# Patient Record
Sex: Male | Born: 1970
Health system: Southern US, Community
[De-identification: ages and names within clinical notes are randomized; demographics above are authoritative.]

## PROBLEM LIST (undated history)

## (undated) DIAGNOSIS — M1990 Unspecified osteoarthritis, unspecified site: Secondary | ICD-10-CM

## (undated) HISTORY — PX: OTHER SURGICAL HISTORY: SHX169

---

## 2004-11-06 ENCOUNTER — Encounter: Admission: RE | Admit: 2004-11-06 | Discharge: 2004-11-06 | Payer: Self-pay | Admitting: Gastroenterology

## 2004-11-13 ENCOUNTER — Encounter (INDEPENDENT_AMBULATORY_CARE_PROVIDER_SITE_OTHER): Payer: Self-pay | Admitting: *Deleted

## 2004-11-13 ENCOUNTER — Ambulatory Visit (HOSPITAL_COMMUNITY): Admission: RE | Admit: 2004-11-13 | Discharge: 2004-11-13 | Payer: Self-pay | Admitting: Gastroenterology

## 2005-06-21 ENCOUNTER — Ambulatory Visit (HOSPITAL_BASED_OUTPATIENT_CLINIC_OR_DEPARTMENT_OTHER): Admission: RE | Admit: 2005-06-21 | Discharge: 2005-06-21 | Payer: Self-pay | Admitting: Orthopedic Surgery

## 2006-05-22 ENCOUNTER — Ambulatory Visit (HOSPITAL_COMMUNITY): Admission: RE | Admit: 2006-05-22 | Discharge: 2006-05-22 | Payer: Self-pay | Admitting: Orthopaedic Surgery

## 2006-10-24 ENCOUNTER — Ambulatory Visit (HOSPITAL_BASED_OUTPATIENT_CLINIC_OR_DEPARTMENT_OTHER): Admission: RE | Admit: 2006-10-24 | Discharge: 2006-10-24 | Payer: Self-pay | Admitting: Orthopaedic Surgery

## 2007-07-12 ENCOUNTER — Emergency Department (HOSPITAL_COMMUNITY): Admission: EM | Admit: 2007-07-12 | Discharge: 2007-07-12 | Payer: Self-pay | Admitting: Emergency Medicine

## 2007-09-09 ENCOUNTER — Ambulatory Visit (HOSPITAL_COMMUNITY): Admission: RE | Admit: 2007-09-09 | Discharge: 2007-09-09 | Payer: Self-pay | Admitting: Orthopedic Surgery

## 2008-06-30 ENCOUNTER — Encounter: Admission: RE | Admit: 2008-06-30 | Discharge: 2008-06-30 | Payer: Self-pay | Admitting: Family Medicine

## 2009-01-27 ENCOUNTER — Ambulatory Visit (HOSPITAL_COMMUNITY): Admission: RE | Admit: 2009-01-27 | Discharge: 2009-01-27 | Payer: Self-pay | Admitting: Orthopedic Surgery

## 2010-03-27 ENCOUNTER — Emergency Department (HOSPITAL_COMMUNITY): Admission: EM | Admit: 2010-03-27 | Discharge: 2010-03-27 | Payer: Self-pay | Admitting: Family Medicine

## 2010-04-07 ENCOUNTER — Ambulatory Visit
Admission: RE | Admit: 2010-04-07 | Discharge: 2010-04-07 | Payer: Self-pay | Source: Home / Self Care | Attending: Orthopaedic Surgery | Admitting: Orthopaedic Surgery

## 2010-05-26 NOTE — Op Note (Signed)
  NAME:  [name] [name], [name] [name]                  ACCOUNT NO.:  [id number]  MEDICAL RECORD NO.:  [id number]          PATIENT TYPE:  AMB  LOCATION:  DSC                          FACILITY:  [facility]  PHYSICIAN:  Claude Manges. Whitfield, M.D.DATE OF BIRTH:  11-02-1970  DATE OF PROCEDURE:  04/07/2010 DATE OF DISCHARGE:                              OPERATIVE REPORT   PREOPERATIVE DIAGNOSIS:  Left carpal tunnel syndrome.  POSTOPERATIVE DIAGNOSIS:  Left carpal tunnel syndrome.  PROCEDURE:  Release of volar carpal ligament, left wrist with decompression of median nerve.  SURGEON:  Claude Manges. Cleophas Dunker, MD  ASSISTANT:  Oris Drone. Petrarca, PAC  ANESTHESIA:  Bier block with IV Xylocaine.  COMPLICATIONS:  None.  HISTORY:  A 40 year old gentleman has EMG diagnosed left carpal tunnel syndrome.  He has reached a point where it has become a compromise of his activity.  He is having numbness when he drives his car at night and even with his vocational activities.  He has had a prior right carpal tunnel release approximately 5 years ago with excellent result, and now wishes to proceed with release of the left carpal tunnel.  PROCEDURE IN DETAIL:  [name] [name] was met in the holding area.  I marked his left hand as the appropriate operative site.  The patient was then transported to room #4 and IV Xylocaine was administered by Anesthesia. Tourniquet was applied to the left arm.  The arm was prepped with DuraPrep in tips of the fingers.  The tourniquet sterile draping was performed.  A longitudinal incision just over an inch in length was outlined along the longitudinal palmar crease.  Via sharp dissection, incision was carried down to the subcutaneous tissue.  Skin was thick.  There was considerable fascial tissue that was released bluntly to reveal fibers of the palmaris longus.  These were then carefully separated all the way and radially revealing the volar carpal ligament beneath.  A Glorious Peach was placed along its  distal extent, and I carefully incised its ulnar border.  The median nerve was identified beneath.  The retractor was then placed underneath the wrist skin and the volar carpal ligament was completely released under direct visualization along its entire length, thus completely decompressing the nerve.  The nerve was quite pale along an area of about an inch without a median vein and reconstituted at the end of the procedure.  The motor recurrent branch was intact.  I did not see evidence of tenosynovitis.  Wound was then irrigated with saline solution.  The skin was closed with interrupted 4-0 Ethilon.  A sterile bulky dressing was applied with posterior splints and Ace bandage.  Tourniquet was deflated with immediate capillary refill to the fingers.  The patient tolerated the procedure without complications.  PLAN:  Oxycodone for pain.  Office in 1 week.     Claude Manges. Cleophas Dunker, M.D.     PWW/MEDQ  D:  04/07/2010  T:  04/07/2010  Job:  [id number]  Electronically Signed by Norlene Campbell M.D. on 05/24/2010 09:02:15 AM

## 2010-07-10 LAB — POCT HEMOGLOBIN-HEMACUE: Hemoglobin: 14.4 g/dL (ref 13.0–17.0)

## 2010-09-12 NOTE — Op Note (Signed)
NAME:  [name] [name], [name] [name]                  ACCOUNT NO.:  [id number]   MEDICAL RECORD NO.:  [id number]           PATIENT TYPE:   LOCATION:                                 FACILITY:   PHYSICIAN:  Claude Manges. Whitfield, M.D.DATE OF BIRTH:  1970-05-23   DATE OF PROCEDURE:  10/24/2006  DATE OF DISCHARGE:                               OPERATIVE REPORT   PREOPERATIVE DIAGNOSIS:  Impingement left shoulder.   POSTOPERATIVE DIAGNOSIS:  Impingement left shoulder.   PROCEDURE:  Diagnostic arthroscopy left shoulder with arthroscopic  subacromial decompression.   SURGEON:  Claude Manges. Cleophas Dunker, M.D.   ASSISTANT:  Legrand Pitts. Duffy, P.A.-C   ANESTHESIA:  General with interscalene nerve block.   COMPLICATIONS:  None.   HISTORY:  A 40 year old gentleman has been followed for months in  reference to his left shoulder.  He has had injection in November in the  subacromial region that made a huge difference; and has had recurrence  to the point of compromise.  Clinically he has impingement.  He has had  an MRI scan that reveals mild-appearing rotator cuff tendinopathy  without a tear.  The [facility] joint was unremarkable.  The biceps tendon was  in the groove; and there was no evidence of a labral tear.  He is now to  have an arthroscopic evaluation.   DESCRIPTION OF PROCEDURE:  The patient was comfortable on the operating  room table; and under general orotracheal anesthesia the patient was  placed in a semi-sitting position with the shoulder frame.  He did have  a preoperative interscalene nerve block.  The left shoulder was then  prepped with DuraPrep in the base of neck circumferentially below the  elbow.  Sterile draping was performed.   A marking pen was used to outline the coracoid, the acromion and the [facility]  joint.  At a point of fingerbreadth posterior medial to the angle of the  acromion a small stab wound was made; and the arthroscope easily placed  into the shoulder joint.  Diagnostic arthroscopy  revealed no evidence of  chondromalacia of the humeral head or the glenoid.  The anterior glenoid  labrum appeared to be intact.  The subscapularis was intact.  There were  no loose bodies.  Biceps tendon was intact.  There was no evidence of a  rotator cuff tear.   The arthroscope was then placed in the subacromial space posteriorly;  the cannula and subacromial space anteriorly, and a third portal  established in the lateral subacromial space.  Arthroscopy revealed  moderate amount of bursal tissue.   Accordingly, ArthroCare wand was introduced and this was debrided so  that I could better visualize the cuff.  I did not see any evidence of  bursal cuff tear irritation.  The [facility] joint appeared to be intact.  There  was considerable overhang of the acromion; and a very thickened CA  ligament.  The ArthroCare wand was used to release the CA ligament and a  6-mm bur was used to perform the subacromial decompression.  He had  very nice decompression, and a nice dry  field.  At that point the three  wounds were irrigated.  The anterior wound was closed with interrupted 4-  0 Ethilon.  They were infiltrated with 0.25% Marcaine with epinephrine.  A sterile bulky dressing was applied followed by a sling.  The patient  tolerated the procedure without complications.   PLAN:  Percocet for pain.  Office in 1 week.      Claude Manges. Cleophas Dunker, M.D.  Electronically Signed     PWW/MEDQ  D:  10/24/2006  T:  10/24/2006  Job:  [id number]

## 2010-09-15 NOTE — Op Note (Signed)
NAME:  [name] [name], [name] [name]                  ACCOUNT NO.:  [id number]   MEDICAL RECORD NO.:  [id number]          PATIENT TYPE:  AMB   LOCATION:  ENDO                         FACILITY:  [facility]   PHYSICIAN:  Petra Kuba, M.D.    DATE OF BIRTH:  1971-04-01   DATE OF PROCEDURE:  11/13/2004  DATE OF DISCHARGE:                                 OPERATIVE REPORT   PROCEDURE:  Esophagogastroduodenoscopy with biopsy.   ENDOSCOPIST:  Petra Kuba, M.D.   INDICATIONS:  Abdominal pain, mild anemia, nondiagnostic ultrasound. Consent  was signed after risks, benefits, methods, options thoroughly discussed in  the office.   MEDICINES USED:  Demerol 60, Versed 6 mg.   DESCRIPTION OF PROCEDURE:  The v ideo endoscope was inserted by direct  vision. The esophagus was normal.  The scope passed in the stomach and  advanced to the antrum where some very minimal amount of antritis was seen  and advanced into a normal duodenal bulb around the C-loop to a normal  second portion of the duodenum.  Normal-appearing ampulla was quickly seen.  The scope was slowly withdrawn back to the bulb and a good look there ruled  out ulcers in that location.  The scope was withdrawn back the stomach and  retroflexed. Angularis, cardia, fundus, lesser and greater curve were normal  on retroflexed visualization.  Straight visualization of the stomach  confirmed some very minimal gastritis. No other abnormalities.  We went  ahead and took two biopsies of the antrum, two of the proximal stomach to  rule out Helicobacter.  The scope was reinserted into the duodenum one more  time which confirmed normal appearance.  The scope was withdrawn.  The air  was suctioned and the scope removed.  The esophagus was normal. Scope was  removed. The patient tolerated the procedure well. There was no obvious  immediate complication.   ENDOSCOPIC DIAGNOSIS:  1.  Minimal gastritis status post biopsy.  2.  Otherwise within normal limits  esophagogastroduodenoscopy.   PLAN:  1.  Continue Nexium.  2.  Get Dr. Michaelle Copas labs.  3.  Call me p.r.n.; otherwise follow up in six weeks and await pathology to      see if Helicobacter treatment may be tried at some point in the future      if symptoms continue and nothing else shows up.       MEM/MEDQ  D:  11/13/2004  T:  11/13/2004  Job:  [id number]
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
## 2010-12-12 ENCOUNTER — Other Ambulatory Visit (HOSPITAL_COMMUNITY): Payer: Self-pay | Admitting: Family Medicine

## 2010-12-12 DIAGNOSIS — R1011 Right upper quadrant pain: Secondary | ICD-10-CM

## 2010-12-13 ENCOUNTER — Ambulatory Visit (HOSPITAL_COMMUNITY)
Admission: RE | Admit: 2010-12-13 | Discharge: 2010-12-13 | Disposition: A | Discharge status: Home / Self Care | Payer: 59 | Source: Ambulatory Visit | Attending: Family Medicine | Admitting: Family Medicine

## 2010-12-13 DIAGNOSIS — R1011 Right upper quadrant pain: Secondary | ICD-10-CM | POA: Insufficient documentation

## 2011-01-22 LAB — URINALYSIS, ROUTINE W REFLEX MICROSCOPIC
Bilirubin Urine: NEGATIVE
Glucose, UA: NEGATIVE
Hgb urine dipstick: NEGATIVE
Ketones, ur: NEGATIVE
Nitrite: NEGATIVE
Protein, ur: NEGATIVE
Specific Gravity, Urine: 1.016
Urobilinogen, UA: 0.2
pH: 6.5

## 2011-01-22 LAB — POCT URINE HEMOGLOBIN: Hgb urine dipstick: NEGATIVE

## 2011-02-14 LAB — POCT HEMOGLOBIN-HEMACUE
Hemoglobin: 16.6
Operator id: 128471

## 2011-11-15 ENCOUNTER — Encounter (HOSPITAL_BASED_OUTPATIENT_CLINIC_OR_DEPARTMENT_OTHER): Payer: Self-pay | Admitting: *Deleted

## 2011-11-22 ENCOUNTER — Encounter (HOSPITAL_BASED_OUTPATIENT_CLINIC_OR_DEPARTMENT_OTHER): Payer: Self-pay | Admitting: Anesthesiology

## 2011-11-22 ENCOUNTER — Ambulatory Visit (HOSPITAL_BASED_OUTPATIENT_CLINIC_OR_DEPARTMENT_OTHER)
Admission: RE | Admit: 2011-11-22 | Discharge: 2011-11-22 | Disposition: A | Discharge status: Home / Self Care | Payer: 59 | Source: Ambulatory Visit | Attending: Orthopaedic Surgery | Admitting: Orthopaedic Surgery

## 2011-11-22 ENCOUNTER — Ambulatory Visit (HOSPITAL_BASED_OUTPATIENT_CLINIC_OR_DEPARTMENT_OTHER): Payer: 59 | Admitting: Anesthesiology

## 2011-11-22 ENCOUNTER — Encounter (HOSPITAL_BASED_OUTPATIENT_CLINIC_OR_DEPARTMENT_OTHER)
Admission: RE | Disposition: A | Discharge status: Home / Self Care | Payer: Self-pay | Source: Ambulatory Visit | Attending: Orthopaedic Surgery

## 2011-11-22 ENCOUNTER — Encounter (HOSPITAL_BASED_OUTPATIENT_CLINIC_OR_DEPARTMENT_OTHER): Payer: Self-pay | Admitting: *Deleted

## 2011-11-22 DIAGNOSIS — M2240 Chondromalacia patellae, unspecified knee: Secondary | ICD-10-CM | POA: Insufficient documentation

## 2011-11-22 DIAGNOSIS — Z9889 Other specified postprocedural states: Secondary | ICD-10-CM

## 2011-11-22 HISTORY — DX: Unspecified osteoarthritis, unspecified site: M19.90

## 2011-11-22 HISTORY — PX: KNEE ARTHROSCOPY: SHX127

## 2011-11-22 LAB — POCT HEMOGLOBIN-HEMACUE: Hemoglobin: 14.2 g/dL (ref 13.0–17.0)

## 2011-11-22 SURGERY — ARTHROSCOPY, KNEE
Anesthesia: General | Site: Knee | Laterality: Right | Wound class: Clean

## 2011-11-22 MED ORDER — HYDROMORPHONE HCL PF 1 MG/ML IJ SOLN
0.2500 mg | INTRAMUSCULAR | Status: DC | PRN
  Administered 2011-11-22 (×2): 0.5 mg via INTRAVENOUS

## 2011-11-22 MED ORDER — SODIUM CHLORIDE 0.9 % IR SOLN
Status: DC | PRN
  Administered 2011-11-22: 3000 mL

## 2011-11-22 MED ORDER — OXYCODONE-ACETAMINOPHEN 5-325 MG PO TABS
1.0000 | ORAL_TABLET | ORAL | Status: DC | PRN
  Administered 2011-11-22: 1 via ORAL

## 2011-11-22 MED ORDER — PROPOFOL 10 MG/ML IV EMUL
INTRAVENOUS | Status: DC | PRN
  Administered 2011-11-22: 200 mg via INTRAVENOUS

## 2011-11-22 MED ORDER — BUPIVACAINE-EPINEPHRINE 0.25% -1:200000 IJ SOLN
INTRAMUSCULAR | Status: DC | PRN
  Administered 2011-11-22: 15 mL

## 2011-11-22 MED ORDER — LIDOCAINE HCL (CARDIAC) 20 MG/ML IV SOLN
INTRAVENOUS | Status: DC | PRN
  Administered 2011-11-22: 100 mg via INTRAVENOUS

## 2011-11-22 MED ORDER — ONDANSETRON HCL 4 MG/2ML IJ SOLN: 4.0000 mg | Freq: Four times a day (QID) | INTRAMUSCULAR | Status: DC | PRN

## 2011-11-22 MED ORDER — CHLORHEXIDINE GLUCONATE 4 % EX LIQD: 60.0000 mL | Freq: Once | CUTANEOUS | Status: DC

## 2011-11-22 MED ORDER — DEXAMETHASONE SODIUM PHOSPHATE 10 MG/ML IJ SOLN
INTRAMUSCULAR | Status: DC | PRN
  Administered 2011-11-22: 10 mg via INTRAVENOUS

## 2011-11-22 MED ORDER — OXYCODONE-ACETAMINOPHEN 5-325 MG PO TABS: 1.0000 | ORAL_TABLET | ORAL | Status: AC | PRN

## 2011-11-22 MED ORDER — ONDANSETRON HCL 4 MG/2ML IJ SOLN
INTRAMUSCULAR | Status: DC | PRN
  Administered 2011-11-22: 4 mg via INTRAVENOUS

## 2011-11-22 MED ORDER — LACTATED RINGERS IV SOLN
INTRAVENOUS | Status: DC
  Administered 2011-11-22: 13:00:00 via INTRAVENOUS

## 2011-11-22 MED ORDER — FENTANYL CITRATE 0.05 MG/ML IJ SOLN
INTRAMUSCULAR | Status: DC | PRN
  Administered 2011-11-22 (×2): 50 ug via INTRAVENOUS
  Administered 2011-11-22: 100 ug via INTRAVENOUS

## 2011-11-22 SURGICAL SUPPLY — 38 items
BANDAGE ELASTIC 6 VELCRO ST LF (GAUZE/BANDAGES/DRESSINGS) ×2 IMPLANT
BANDAGE GAUZE ELAST BULKY 4 IN (GAUZE/BANDAGES/DRESSINGS) ×2 IMPLANT
BLADE 4.2CUDA (BLADE) IMPLANT
BLADE CUDA SHAVER 3.5 (BLADE) ×2 IMPLANT
BLADE CUTTER GATOR 3.5 (BLADE) IMPLANT
BLADE GREAT WHITE 4.2 (BLADE) IMPLANT
CANISTER OMNI JUG 16 LITER (MISCELLANEOUS) ×2 IMPLANT
CANISTER SUCTION 2500CC (MISCELLANEOUS) IMPLANT
DRAPE ARTHROSCOPY W/POUCH 90 (DRAPES) ×2 IMPLANT
DRSG EMULSION OIL 3X3 NADH (GAUZE/BANDAGES/DRESSINGS) ×2 IMPLANT
DURAPREP 26ML APPLICATOR (WOUND CARE) ×2 IMPLANT
ELECT MENISCUS 165MM 90D (ELECTRODE) IMPLANT
ELECT REM PT RETURN 9FT ADLT (ELECTROSURGICAL)
ELECTRODE REM PT RTRN 9FT ADLT (ELECTROSURGICAL) IMPLANT
GLOVE BIO SURGEON STRL SZ 6.5 (GLOVE) ×2 IMPLANT
GLOVE BIO SURGEON STRL SZ7 (GLOVE) IMPLANT
GLOVE BIO SURGEON STRL SZ7.5 (GLOVE) IMPLANT
GLOVE BIOGEL PI IND STRL 7.0 (GLOVE) ×1 IMPLANT
GLOVE BIOGEL PI IND STRL 7.5 (GLOVE) IMPLANT
GLOVE BIOGEL PI IND STRL 8.5 (GLOVE) ×1 IMPLANT
GLOVE BIOGEL PI INDICATOR 7.0 (GLOVE) ×1
GLOVE BIOGEL PI INDICATOR 7.5 (GLOVE)
GLOVE BIOGEL PI INDICATOR 8.5 (GLOVE) ×1
GLOVE ECLIPSE 7.0 STRL STRAW (GLOVE) IMPLANT
GLOVE ECLIPSE 8.0 STRL XLNG CF (GLOVE) ×2 IMPLANT
GLOVE SURG ORTHO 8.5 STRL (GLOVE) ×2 IMPLANT
GOWN PREVENTION PLUS XLARGE (GOWN DISPOSABLE) ×2 IMPLANT
HOLDER KNEE FOAM BLUE (MISCELLANEOUS) ×2 IMPLANT
KNEE WRAP E Z 3 GEL PACK (MISCELLANEOUS) ×2 IMPLANT
PACK ARTHROSCOPY DSU (CUSTOM PROCEDURE TRAY) ×2 IMPLANT
PACK BASIN DAY SURGERY FS (CUSTOM PROCEDURE TRAY) ×2 IMPLANT
PENCIL BUTTON HOLSTER BLD 10FT (ELECTRODE) IMPLANT
SET ARTHROSCOPY TUBING (MISCELLANEOUS) ×1
SET ARTHROSCOPY TUBING LN (MISCELLANEOUS) ×1 IMPLANT
SUT ETHILON 4 0 PS 2 18 (SUTURE) IMPLANT
TOWEL OR 17X24 6PK STRL BLUE (TOWEL DISPOSABLE) ×2 IMPLANT
WAND STAR VAC 90 (SURGICAL WAND) ×2 IMPLANT
WATER STERILE IRR 1000ML POUR (IV SOLUTION) ×2 IMPLANT

## 2011-11-22 NOTE — Anesthesia Postprocedure Evaluation (Signed)
Anesthesia Post Note  Patient: [name] [name]  Procedure(s) Performed: Procedure(s) (LRB): ARTHROSCOPY KNEE (Right)  Anesthesia type: General  Patient location: PACU  Post pain: Pain level controlled  Post assessment: Patient's Cardiovascular Status Stable  Last Vitals:  Filed Vitals:   11/22/11 1530  BP: 125/71  Pulse: 90  Temp:   Resp: 16    Post vital signs: Reviewed and stable  Level of consciousness: alert  Complications: No apparent anesthesia complications

## 2011-11-22 NOTE — Anesthesia Preprocedure Evaluation (Addendum)
Anesthesia Evaluation  Patient identified by MRN, date of birth, ID band Patient awake    Reviewed: Allergy & Precautions, H&P , NPO status , Patient's Chart, lab work & pertinent test results  Airway Mallampati: II  Neck ROM: full    Dental   Pulmonary          Cardiovascular     Neuro/Psych    GI/Hepatic   Endo/Other  obese  Renal/GU      Musculoskeletal  (+) Arthritis -,   Abdominal   Peds  Hematology   Anesthesia Other Findings   Reproductive/Obstetrics                           Anesthesia Physical Anesthesia Plan  ASA: I  Anesthesia Plan: General   Post-op Pain Management:    Induction: Intravenous  Airway Management Planned: LMA  Additional Equipment:   Intra-op Plan:   Post-operative Plan:   Informed Consent: I have reviewed the patients History and Physical, chart, labs and discussed the procedure including the risks, benefits and alternatives for the proposed anesthesia with the patient or authorized representative who has indicated his/her understanding and acceptance.     Plan Discussed with: CRNA and Surgeon  Anesthesia Plan Comments:        Anesthesia Quick Evaluation

## 2011-11-22 NOTE — Anesthesia Procedure Notes (Signed)
Procedure Name: LMA Insertion Date/Time: 11/22/2011 1:59 PM Performed by: Meyer Russel Pre-anesthesia Checklist: Patient identified, Emergency Drugs available, Suction available and Patient being monitored Patient Re-evaluated:Patient Re-evaluated prior to inductionOxygen Delivery Method: Circle System Utilized Preoxygenation: Pre-oxygenation with 100% oxygen Intubation Type: IV induction Ventilation: Mask ventilation without difficulty LMA: LMA inserted LMA Size: 5.0 Number of attempts: 1 Airway Equipment and Method: bite block Placement Confirmation: positive ETCO2 and breath sounds checked- equal and bilateral Tube secured with: Tape Dental Injury: Teeth and Oropharynx as per pre-operative assessment

## 2011-11-22 NOTE — Progress Notes (Signed)
Patient ID: [name] [name], male   DOB: 07-09-1970, 41 y.o.   MRN: [id number] There has been no change in health status since  the current H&P.I have examined the patient and discussed the surgery. No contraindications to the planned procedure exist.

## 2011-11-22 NOTE — Op Note (Signed)
PATIENT ID:      [place] A Hutcherson  MRN:     [id number] DOB/AGE:    November 30, 1970 / 41 y.o.       OPERATIVE REPORT    DATE OF PROCEDURE:  11/22/2011       PREOPERATIVE DIAGNOSIS: right knee pain anteriorly with possible chondromalacia patella, possible symptomatic plica                                                      Body mass index is 31.15 kg/(m^2).     POSTOPERATIVE DIAGNOSIS:  Mild chondromalacia patella, enlarged fat pad                                                         Body mass index is 31.15 kg/(m^2).     PROCEDURE:  Diagnostic arthroscopy right knee with debridement of enlarged fat pad      SURGEON:   Norlene Campbell, MD  11/22/2011, 2:48 PM   ASSISTANT:  Oris Drone. Aleda Grana [facility] [phone]  11/22/2011 2:48 PM   (present throughout entire procedure and necessary for timely completion of the procedure)           ANESTHESIA:  General      COMPLICATIONS:  None          PROCEDURE IN DETAIL: [id number]  CONDITION:  stable   Norlene Campbell, MD  11/22/2011, 2:48 PM 11/22/2011, @2 :48 PM

## 2011-11-22 NOTE — Transfer of Care (Signed)
Immediate Anesthesia Transfer of Care Note  Patient: [name] [name]  Procedure(s) Performed: Procedure(s) (LRB): ARTHROSCOPY KNEE (Right)  Patient Location: PACU  Anesthesia Type: General  Level of Consciousness: awake, alert  and oriented  Airway & Oxygen Therapy: Patient Spontanous Breathing and Patient connected to face mask oxygen  Post-op Assessment: Report given to PACU RN, Post -op Vital signs reviewed and stable and Patient moving all extremities  Post vital signs: Reviewed and stable  Complications: No apparent anesthesia complications

## 2011-11-22 NOTE — H&P (Signed)
    [name] Campbell, MD   [name] Code, PA-C [address] [place], [place], [place]  [address]                             [phone]  ORTHOPAEDIC HISTORY & PHYSICAL  [name] [name] MRN:  [id number] DOB/SEX:  Dec 22, 1970/male  CHIEF COMPLAINT:  Painful right Knee  HISTORY: Patient is a 41 y.o. male presented with a history of pain in the right knee for 5 years. Has been seen before and MRI scan was performed revealing popliteal cyst.  Pain anterior knee without MJL or LJL pain.  Pain mainly anterior knee.  Plan for arthroscopic evaluation.  PAST MEDICAL HISTORY: There are no active problems to display for this patient.  Past Medical History  Diagnosis Date  . Arthritis     osteoarthritis   Past Surgical History  Procedure Date  . Right and left carpal tunnel release   . Left shoulder  sad      MEDICATIONS:   Prescriptions prior to admission  Medication Sig Dispense Refill  . atorvastatin (LIPITOR) 10 MG tablet Take 10 mg by mouth daily.      . cholecalciferol (VITAMIN D) 1000 UNITS tablet Take 1,000 Units by mouth daily.      . fluticasone (FLONASE) 50 MCG/ACT nasal spray Place 2 sprays into the nose daily.      . Multiple Vitamin (MULTIVITAMIN) tablet Take 1 tablet by mouth daily.        ALLERGIES:   Allergies  Allergen Reactions  . Ibuprofen Other (See Comments)    Stomach pain    REVIEW OF SYSTEMS:  A comprehensive review of systems was negative.   FAMILY HISTORY:  History reviewed. No pertinent family history.  SOCIAL HISTORY:   History  Substance Use Topics  . Smoking status: Never Smoker   . Smokeless tobacco: Not on file  . Alcohol Use: No      EXAMINATION: Vital signs in last 24 hours: Temp:  [98.3 F (36.8 C)] 98.3 F (36.8 C) (07/25 1221) Pulse Rate:  [78] 78  (07/25 1221) Resp:  [20] 20  (07/25 1221) BP: (118)/(87) 118/87 mmHg (07/25 1221) SpO2:  [100 %] 100 % (07/25 1221)  Head is normocephalic. Eyes: pupils equal, round and react to  light and accommodation with extraocular movements intact.   Neck: supple, no bruits. Chest: good expansion. Lungs: clear to auscultation Cardiac: regular rhythm and rate. Normal S1-S2. No discrete murmurs were noted. Pulses: 1+ bilateral Abdomen: scaphoid, soft, nontender. No mass palpable. Normal bowel sounds present. CNS: oriented x3 and cranial nerves II through XII grossly intact. Genital, rectal and breast exams:  not indicated for surgery. Musculoskeletal: range of motion  0-130 degrees. + crepitus PF joint.  ? Effusion.    ASSESSMENT: Chondromalacia patella right knee  Past Medical History  Diagnosis Date  . Arthritis     osteoarthritis    PLAN: Plan for right knee arthroscopic debridement.  [name] [name] 11/22/2011, 12:42 PM

## 2011-11-23 ENCOUNTER — Encounter (HOSPITAL_BASED_OUTPATIENT_CLINIC_OR_DEPARTMENT_OTHER): Payer: Self-pay | Admitting: Orthopaedic Surgery

## 2011-11-23 NOTE — Op Note (Signed)
NAME:  [name], [name]                  ACCOUNT NO.:  [id number]  MEDICAL RECORD NO.:  [id number]  LOCATION:                               FACILITY:  [facility]  PHYSICIAN:  Claude Manges. Cleophas Dunker, M.D.    DATE OF BIRTH:  DATE OF PROCEDURE:  11/22/2011 DATE OF DISCHARGE:  11/22/2011                              OPERATIVE REPORT   PREOPERATIVE DIAGNOSIS:  Anterior right knee pain with possible chondromalacia patella, possible plica.  POSTOPERATIVE DIAGNOSIS:  Mild chondromalacia of right patella with enlarged, probably symptomatic fat-pad.  PROCEDURES: 1. Diagnostic arthroscopy, right knee. 2. Excision of enlarged fat pad.  SURGEON:  Claude Manges. Cleophas Dunker, MD  ASSISTANT:  Oris Drone. Petrarca, PA-C  ANESTHESIA:  General with local 2% Xylocaine with epinephrine knee block.  COMPLICATIONS:  None.  HISTORY:  A 41 year old gentleman has been evaluated several times over the last 4-5 years for right knee pain.  He was first seen probably in 2006 and 2007.  He experienced pain on the anterior aspect of his knee. An MRI scan revealed no evidence of meniscal tear.  He had mild mucoid degeneration of the ACL and a possible small ganglion or just some loculated fluid near the posterior cruciate ligament, this was completely resolved.  He has had some recurrent pain to the point where his knee pops, catches and clicks.  There was no medial or lateral joint pain, it does come and go.  It depends on his activities, but seems to be worse on climbs or stairs.  Pain is localized anteriorly.  He has had little bit of crepitation.  He has had cortisone injection, anti- inflammatory medicines and still gives him a trouble.  So, we have elected to arthroscope his knee without a preoperative MRI scan.  PROCEDURE:  [name] was met in the holding area, identified the right knee as the appropriate operative site.  He was then transported to room #1 and placed under general anesthesia.  The right lower extremity  was placed a thigh holder, the leg was then prepped with DuraPrep and the thigh holder.  The ankle sterile draping was performed.  I injected 2% Xylocaine with epinephrine on either side of the patellar tendon in the area of the arthroscopic portals, small stab wounds were then made.  The arthroscope was placed into the knee joint.  There was no effusion. There was some very minimal chondromalacia of the patella, probably grade 1.  It looked like there was a mild lateral patellar tilt, but there was no evidence of chondromalacia on the lateral femoral condyle or lateral patella.  I did not see any appreciable synovitis.  There was no loose material, but there was a very enlarged fat pad.  I had difficult time even visualizing the anterior tibia and then as I visualized the joint, the ACL appeared to be intact, the PCL appeared to be intact.  I probed the PCL and I was unable to remove any fluid.  I did not see an obvious cyst.  In the medial compartment, the medial meniscus was intact.  I did not see any appreciable chondromalacia of the femoral condyle, tibial plateau.  The lateral compartment with  similar in appearance without evidence of chondromalacia or lateral meniscal tearing, I was very careful to probe them.  I then returned to the intercondylar notch and I had some difficulty visualizing it because of the enlarged fat pad and because of his anterior pain and the possibility of the fat pad causing some trouble, I elected to debride the fat pad.  This was performed with a Cuda shaver and the ArthroCare wand such that I could now visualized the femoral condyle and the anterior patella.  Nice resection, normal fat on either side was left in place and as I tracked the patella, there was still some lateral tilting, but again without evidence of chondromalacia.  It was an exhibiting lateral patellar pressure symptoms.  The joint was then reevaluated without any loose material.  Two  puncture sites were left open, infiltrated with 0.25% Marcaine with epinephrine.  Sterile bulky dressing was applied followed by an Ace bandage.  The patient tolerated the procedure well without complications.  PLAN:  Oxycodone for pain.  Office 1 week.     Claude Manges. Cleophas Dunker, M.D.     PWW/MEDQ  D:  11/22/2011  T:  11/22/2011  Job:  [id number]

## 2011-11-27 ENCOUNTER — Encounter (HOSPITAL_BASED_OUTPATIENT_CLINIC_OR_DEPARTMENT_OTHER): Payer: Self-pay

## 2014-11-23 ENCOUNTER — Ambulatory Visit (HOSPITAL_BASED_OUTPATIENT_CLINIC_OR_DEPARTMENT_OTHER): Payer: 59 | Attending: Internal Medicine | Admitting: Radiology

## 2014-12-20 ENCOUNTER — Ambulatory Visit (HOSPITAL_BASED_OUTPATIENT_CLINIC_OR_DEPARTMENT_OTHER): Payer: 59

## 2014-12-28 ENCOUNTER — Ambulatory Visit (HOSPITAL_BASED_OUTPATIENT_CLINIC_OR_DEPARTMENT_OTHER): Payer: 59 | Attending: Internal Medicine | Admitting: Radiology

## 2014-12-28 DIAGNOSIS — R0683 Snoring: Secondary | ICD-10-CM | POA: Diagnosis not present

## 2014-12-28 DIAGNOSIS — G4733 Obstructive sleep apnea (adult) (pediatric): Secondary | ICD-10-CM | POA: Diagnosis not present

## 2014-12-28 DIAGNOSIS — G4710 Hypersomnia, unspecified: Secondary | ICD-10-CM | POA: Diagnosis present

## 2015-01-01 DIAGNOSIS — R0683 Snoring: Secondary | ICD-10-CM | POA: Diagnosis not present

## 2015-01-01 DIAGNOSIS — G4733 Obstructive sleep apnea (adult) (pediatric): Secondary | ICD-10-CM | POA: Diagnosis not present

## 2015-01-01 NOTE — Progress Notes (Signed)
   Patient Name: [name] [name], [name] [name] Gender: Male D.O.B: 27-Nov-1970 Age (years): 89 Referring Provider: Not Available Height (inches): 67 Interpreting Physician: Baird Lyons MD, ABSM Weight (lbs): 202 RPSGT: Jacolyn Reedy BMI: 32 MRN: [id number] Neck Size: 15.00 CLINICAL INFORMATION Sleep Study Type: Home Sleep Test- unattended     Indication for sleep study: 780.54 Hypersomnia     Epworth Sleepiness Score:13/24  SLEEP STUDY TECHNIQUE A multi-channel overnight portable sleep study was performed. The channels recorded were: nasal airflow, thoracic respiratory movement, and oxygen saturation with a pulse oximetry. Snoring was also monitored.  MEDICATIONS Patient self administered medications include: charted for review.  SLEEP ARCHITECTURE Patient was studied for 432.4 minutes. The sleep efficiency was 99.9 % and the patient was supine for 95.4%. The arousal index was 0.0 per hour.  RESPIRATORY PARAMETERS The overall AHI was 5.1 per hour, with a central apnea index of 0.4 per hour.  The oxygen nadir was 88% during sleep with mean saturation 96%   CARDIAC DATA Mean heart rate during sleep was 61.1 bpm.  IMPRESSIONS Minimal obstructive sleep apnea occurred during this study (AHI = 5.1/h). No significant central sleep apnea occurred during this study (CAI = 0.4/h). Mild oxygen desaturation was noted during this study (Min O2 = 88%, mean 96%). Patient snored 21.8% of sleep time during the sleep.  DIAGNOSIS Obstructive Sleep Apnea (327.23 [G47.33 ICD-10]) Primary Snoring (786.09 [R06.83 ICD-10])  RECOMMENDATIONS Clinical consideration of therapeutic options Positional therapy avoiding supine position during sleep. Oral appliance may be considered. Avoid alcohol, sedatives and other CNS depressants that may worsen sleep apnea and disrupt normal sleep architecture. Sleep hygiene should be reviewed to assess factors that may improve sleep  quality. Weight management and regular exercise should be initiated or continued.    Deneise Lever Diplomate, American Board of Sleep Medicine  ELECTRONICALLY SIGNED ON:  01/01/2015, 2:15 PM [place] PH: [phone]   FX: [phone] [place]

## 2015-05-05 MED FILL — ATORVASTATIN 10 MG TABLET: 10 | 90 days supply | Qty: 90 | Fill #1

## 2015-05-05 MED FILL — metFORMIN HCL 500 MG TABS: 500 | 90 days supply | Qty: 90 | Fill #1

## 2015-05-10 MED FILL — ANDROGEL 1.62% GEL PUMP: 20.25 MG/AC | 30 days supply | Qty: 75 | Fill #2

## 2015-05-10 MED FILL — MELOXICAM 7.5 MG TABLET: 7.5 | 30 days supply | Qty: 30 | Fill #4

## 2015-06-21 DIAGNOSIS — H6983 Other specified disorders of Eustachian tube, bilateral: Secondary | ICD-10-CM | POA: Diagnosis not present

## 2015-06-21 DIAGNOSIS — H906 Mixed conductive and sensorineural hearing loss, bilateral: Secondary | ICD-10-CM | POA: Diagnosis not present

## 2015-06-30 MED FILL — ANDROGEL 1.62% GEL PUMP: 20.25 MG/AC | 30 days supply | Qty: 75 | Fill #0

## 2015-06-30 MED FILL — MELOXICAM 7.5 MG TABLET: 7.5 | 30 days supply | Qty: 30 | Fill #5

## 2015-08-04 DIAGNOSIS — M79671 Pain in right foot: Secondary | ICD-10-CM | POA: Diagnosis not present

## 2015-08-08 DIAGNOSIS — E559 Vitamin D deficiency, unspecified: Secondary | ICD-10-CM | POA: Diagnosis not present

## 2015-08-08 DIAGNOSIS — E782 Mixed hyperlipidemia: Secondary | ICD-10-CM | POA: Diagnosis not present

## 2015-08-08 DIAGNOSIS — E291 Testicular hypofunction: Secondary | ICD-10-CM | POA: Diagnosis not present

## 2015-08-08 DIAGNOSIS — R7303 Prediabetes: Secondary | ICD-10-CM | POA: Diagnosis not present

## 2015-08-08 MED FILL — metFORMIN HCL 500 MG TABS: 500 | 90 days supply | Qty: 90 | Fill #0

## 2015-08-08 MED FILL — ATORVASTATIN 10 MG TABLET: 10 | 90 days supply | Qty: 90 | Fill #0

## 2015-08-08 MED FILL — ANDROGEL 1.62% GEL PUMP: 20.25 MG/AC | 30 days supply | Qty: 75 | Fill #0

## 2015-09-19 MED FILL — ANDROGEL 1.62% GEL PUMP: 20.25 MG/AC | 30 days supply | Qty: 75 | Fill #1

## 2015-09-19 MED FILL — MELOXICAM 7.5 MG TABLET: 7.5 | 30 days supply | Qty: 30 | Fill #1

## 2015-11-04 MED FILL — ANDROGEL 1.62% GEL PUMP: 20.25 MG/AC | 30 days supply | Qty: 75 | Fill #2

## 2015-11-04 MED FILL — ATORVASTATIN 10 MG TABLET: 10 | 90 days supply | Qty: 90 | Fill #1

## 2015-11-04 MED FILL — metFORMIN HCL 500 MG TABS: 500 | 90 days supply | Qty: 90 | Fill #1

## 2015-12-13 MED FILL — ANDROGEL 1.62% GEL PUMP: 20.25 MG/AC | 30 days supply | Qty: 75 | Fill #3

## 2016-01-16 DIAGNOSIS — H5203 Hypermetropia, bilateral: Secondary | ICD-10-CM | POA: Diagnosis not present

## 2016-01-31 MED FILL — ATORVASTATIN 10 MG TABLET: 10 | 90 days supply | Qty: 90 | Fill #0

## 2016-01-31 MED FILL — metFORMIN HCL 500 MG TABS: 500 | 90 days supply | Qty: 90 | Fill #0

## 2016-01-31 MED FILL — ANDROGEL 1.62% GEL PUMP: 20.25 MG/AC | 30 days supply | Qty: 75 | Fill #4

## 2016-05-30 DIAGNOSIS — H9201 Otalgia, right ear: Secondary | ICD-10-CM | POA: Diagnosis not present

## 2016-05-30 MED FILL — AMOX TR-K CLV 875-125 MG TA: 875-125 | 10 days supply | Qty: 20 | Fill #0

## 2016-06-11 MED FILL — metFORMIN HCL 500 MG TABS: 500 | 30 days supply | Qty: 30 | Fill #0

## 2016-07-11 MED FILL — metFORMIN HCL 500 MG TABS: 500 | 30 days supply | Qty: 30 | Fill #1

## 2016-08-03 DIAGNOSIS — M25552 Pain in left hip: Secondary | ICD-10-CM | POA: Diagnosis not present

## 2016-08-03 DIAGNOSIS — Z0000 Encounter for general adult medical examination without abnormal findings: Secondary | ICD-10-CM | POA: Diagnosis not present

## 2016-08-03 DIAGNOSIS — Z125 Encounter for screening for malignant neoplasm of prostate: Secondary | ICD-10-CM | POA: Diagnosis not present

## 2016-08-03 DIAGNOSIS — E669 Obesity, unspecified: Secondary | ICD-10-CM | POA: Diagnosis not present

## 2016-08-03 DIAGNOSIS — E782 Mixed hyperlipidemia: Secondary | ICD-10-CM | POA: Diagnosis not present

## 2016-08-03 DIAGNOSIS — E291 Testicular hypofunction: Secondary | ICD-10-CM | POA: Diagnosis not present

## 2016-08-03 DIAGNOSIS — M25551 Pain in right hip: Secondary | ICD-10-CM | POA: Diagnosis not present

## 2016-08-03 DIAGNOSIS — E559 Vitamin D deficiency, unspecified: Secondary | ICD-10-CM | POA: Diagnosis not present

## 2016-08-03 DIAGNOSIS — R7303 Prediabetes: Secondary | ICD-10-CM | POA: Diagnosis not present

## 2016-08-03 MED FILL — MELOXICAM 7.5 MG TABLET: 7.5 | 90 days supply | Qty: 90 | Fill #0

## 2016-08-10 MED FILL — metFORMIN HCL 500 MG TABS: 500 | 90 days supply | Qty: 90 | Fill #0

## 2016-09-10 MED FILL — metFORMIN HCL 500 MG TABS: 500 | 60 days supply | Qty: 180 | Fill #0

## 2016-12-05 MED FILL — metFORMIN HCL 500 MG TABS: 500 | 60 days supply | Qty: 180 | Fill #1

## 2016-12-19 DIAGNOSIS — H8113 Benign paroxysmal vertigo, bilateral: Secondary | ICD-10-CM | POA: Diagnosis not present

## 2016-12-19 DIAGNOSIS — H6692 Otitis media, unspecified, left ear: Secondary | ICD-10-CM | POA: Diagnosis not present

## 2016-12-19 DIAGNOSIS — J309 Allergic rhinitis, unspecified: Secondary | ICD-10-CM | POA: Diagnosis not present

## 2016-12-19 MED FILL — ONDANSETRON HCL 4 MG TABLET: 4 | 10 days supply | Qty: 30 | Fill #0

## 2016-12-19 MED FILL — AMOX TR-K CLV 875-125 MG TA: 875-125 | 10 days supply | Qty: 20 | Fill #0

## 2016-12-19 MED FILL — MECLIZINE 25 MG TABLET: 25 | 30 days supply | Qty: 30 | Fill #0

## 2016-12-19 MED FILL — FLUTICASONE PROP 50 MCG SPR: 50 | 60 days supply | Qty: 16 | Fill #0

## 2017-01-15 MED FILL — MELOXICAM 7.5 MG TABLET: 7.5 | 90 days supply | Qty: 90 | Fill #1

## 2017-03-20 MED FILL — metFORMIN HCL 500 MG TABS: 500 | 60 days supply | Qty: 180 | Fill #0

## 2017-09-05 ENCOUNTER — Ambulatory Visit (INDEPENDENT_AMBULATORY_CARE_PROVIDER_SITE_OTHER): Payer: Self-pay | Admitting: Nurse Practitioner

## 2017-09-05 VITALS — BP 110/78 | HR 78 | Temp 99.0°F | Resp 18

## 2017-09-05 DIAGNOSIS — H6693 Otitis media, unspecified, bilateral: Secondary | ICD-10-CM

## 2017-09-05 MED ORDER — MONTELUKAST SODIUM 10 MG PO TABS
10.0000 mg | ORAL_TABLET | Freq: Every day | ORAL | 1 refills | Status: DC
Start: 2017-09-05 — End: 2020-05-31

## 2017-09-05 MED ORDER — AMOXICILLIN 875 MG PO TABS: 875.0000 mg | ORAL_TABLET | Freq: Two times a day (BID) | ORAL | 0 refills | Status: AC

## 2017-09-05 MED ORDER — CIPROFLOXACIN-DEXAMETHASONE 0.3-0.1 % OT SUSP: 4.0000 [drp] | Freq: Two times a day (BID) | OTIC | 0 refills | Status: AC

## 2017-09-05 MED ORDER — FLUTICASONE PROPIONATE 50 MCG/ACT NA SUSP: 2.0000 | Freq: Every day | NASAL | 0 refills | Status: AC

## 2017-09-05 MED FILL — MONTELUKAST SOD 10 MG TAB: 10 | 30 days supply | Qty: 30 | Fill #0

## 2017-09-05 MED FILL — CIPRODEX OTIC SUSPENSION: 0.3-0.1 | 10 days supply | Qty: 8 | Fill #0

## 2017-09-05 MED FILL — AMOXICILLIN 875 MG TABLET: 875 | 10 days supply | Qty: 20 | Fill #0

## 2017-09-05 MED FILL — FLUTICASONE PROP 50 MCG SPR: 50 | 30 days supply | Qty: 16 | Fill #0

## 2017-09-05 NOTE — Patient Instructions (Signed)

## 2017-09-05 NOTE — Progress Notes (Signed)
   Subjective:    Patient ID: [name] [name], male    DOB: [birth year], 47 y.o.   MRN: [id number]  The patient is a 47 year old male who presents with complaints of ear fullness, pressure, and pulsating for 2 days.  The patient states he has a long history of eustachian tube dysfunction, which requires the use of tubes in his ears over the last 15 years.  Patient is unsure if his tubes have fallen out, as he states he normally gets an ear infection if so.  Patient states that he does not have any ear pain, but does state he has had nasal congestion, ear pressure/fullness, and pulsating in his ears for 2 days.  Patient states he has had a low-grade fever, but denies any ear drainage.  Patient also admits to a history of seasonal allergies for which he takes fluticasone and Zyrtec.  Patient sees Dr. Elwyn Reach for his ears, and states the earliest appointment was in 6 weeks.  Reviewed patient's past medical history, current medications, and allergies.   Review of Systems  Constitutional: Positive for fever. Negative for activity change, appetite change, chills and fatigue.  HENT: Positive for congestion and postnasal drip.        Bilateral ear fullness/pressure. Can feel pulsating in ears  Eyes: Negative.   Respiratory: Positive for cough.   Cardiovascular: Negative.   Skin: Negative.   Allergic/Immunologic: Positive for environmental allergies.  Neurological: Negative.        Objective:   Physical Exam  Constitutional: He is oriented to person, place, and time. He appears well-developed and well-nourished. No distress.  HENT:  Head: Normocephalic and atraumatic.  Tube in right ear, TM erythematous. Unsure if tube remains in left ear, TM erythematous with serous mucoid fluid.   Eyes: Pupils are equal, round, and reactive to light. Conjunctivae and EOM are normal.  Neck: Normal range of motion. Neck supple.  Cardiovascular: Normal rate, regular rhythm and normal heart sounds.  Pulmonary/Chest:  Effort normal and breath sounds normal.  Abdominal: Soft. Bowel sounds are normal.  Neurological: He is alert and oriented to person, place, and time.  Skin: Skin is warm and dry.  Psychiatric: He has a normal mood and affect.  Vitals reviewed.     Assessment & Plan:  Bilateral otitis media 1.  Patient given prescription for amoxicillin 875 mg twice daily for 10 days.  Patient also requested Ciprodex drops for his ears as he states this sometimes works better for him.  Prescription for the Ciprodex was printed and given to the patient if needed. 2.  Patient given prescription for fluticasone, 2 sprays in each nostril daily for 10 days. 3.  Patient given prescription for Singulair 10 mg to take at bedtime with 1 refill. 4.  Patient education provided for otitis media. 5.  Patient will follow with Dr. Elwyn Reach, but able to return if needed if symptoms do not improve.  Patient verbalizes understanding and had no questions at time of discharge.

## 2017-09-09 ENCOUNTER — Telehealth: Payer: Self-pay

## 2017-09-09 NOTE — Telephone Encounter (Signed)
I left a message to the patient asking to call us back. 

## 2017-09-16 DIAGNOSIS — H906 Mixed conductive and sensorineural hearing loss, bilateral: Secondary | ICD-10-CM | POA: Diagnosis not present

## 2017-09-16 DIAGNOSIS — H6983 Other specified disorders of Eustachian tube, bilateral: Secondary | ICD-10-CM | POA: Diagnosis not present

## 2017-10-10 DIAGNOSIS — H6983 Other specified disorders of Eustachian tube, bilateral: Secondary | ICD-10-CM | POA: Diagnosis not present

## 2017-10-10 DIAGNOSIS — H906 Mixed conductive and sensorineural hearing loss, bilateral: Secondary | ICD-10-CM | POA: Diagnosis not present

## 2017-10-25 DIAGNOSIS — Z125 Encounter for screening for malignant neoplasm of prostate: Secondary | ICD-10-CM | POA: Diagnosis not present

## 2017-10-25 DIAGNOSIS — R7303 Prediabetes: Secondary | ICD-10-CM | POA: Diagnosis not present

## 2017-10-25 DIAGNOSIS — E559 Vitamin D deficiency, unspecified: Secondary | ICD-10-CM | POA: Diagnosis not present

## 2017-10-25 DIAGNOSIS — E291 Testicular hypofunction: Secondary | ICD-10-CM | POA: Diagnosis not present

## 2017-10-25 DIAGNOSIS — E782 Mixed hyperlipidemia: Secondary | ICD-10-CM | POA: Diagnosis not present

## 2017-10-25 DIAGNOSIS — J309 Allergic rhinitis, unspecified: Secondary | ICD-10-CM | POA: Diagnosis not present

## 2017-10-29 MED FILL — metFORMIN HCL 500 MG TABS: 500 | 90 days supply | Qty: 270 | Fill #0

## 2018-01-28 DIAGNOSIS — K219 Gastro-esophageal reflux disease without esophagitis: Secondary | ICD-10-CM | POA: Diagnosis not present

## 2018-01-28 DIAGNOSIS — E291 Testicular hypofunction: Secondary | ICD-10-CM | POA: Diagnosis not present

## 2018-01-28 DIAGNOSIS — E782 Mixed hyperlipidemia: Secondary | ICD-10-CM | POA: Diagnosis not present

## 2018-01-28 DIAGNOSIS — E559 Vitamin D deficiency, unspecified: Secondary | ICD-10-CM | POA: Diagnosis not present

## 2018-01-28 DIAGNOSIS — R7303 Prediabetes: Secondary | ICD-10-CM | POA: Diagnosis not present

## 2018-01-28 DIAGNOSIS — J309 Allergic rhinitis, unspecified: Secondary | ICD-10-CM | POA: Diagnosis not present

## 2018-01-28 DIAGNOSIS — Z0000 Encounter for general adult medical examination without abnormal findings: Secondary | ICD-10-CM | POA: Diagnosis not present

## 2018-01-28 MED FILL — metFORMIN HCL 500 MG TABS: 500 | 60 days supply | Qty: 180 | Fill #0

## 2018-01-28 MED FILL — ATORVASTATIN 10 MG TABLET: 10 | 84 days supply | Qty: 48 | Fill #0

## 2018-02-10 DIAGNOSIS — E291 Testicular hypofunction: Secondary | ICD-10-CM | POA: Diagnosis not present

## 2018-02-10 DIAGNOSIS — E782 Mixed hyperlipidemia: Secondary | ICD-10-CM | POA: Diagnosis not present

## 2018-02-10 DIAGNOSIS — R7303 Prediabetes: Secondary | ICD-10-CM | POA: Diagnosis not present

## 2018-04-24 MED FILL — metFORMIN HCL 500 MG TABS: 500 | 60 days supply | Qty: 180 | Fill #1

## 2018-06-27 DIAGNOSIS — R7303 Prediabetes: Secondary | ICD-10-CM | POA: Diagnosis not present

## 2018-06-27 DIAGNOSIS — E559 Vitamin D deficiency, unspecified: Secondary | ICD-10-CM | POA: Diagnosis not present

## 2018-06-27 DIAGNOSIS — E782 Mixed hyperlipidemia: Secondary | ICD-10-CM | POA: Diagnosis not present

## 2018-06-27 DIAGNOSIS — N529 Male erectile dysfunction, unspecified: Secondary | ICD-10-CM | POA: Diagnosis not present

## 2018-06-27 DIAGNOSIS — E291 Testicular hypofunction: Secondary | ICD-10-CM | POA: Diagnosis not present

## 2018-06-27 DIAGNOSIS — J309 Allergic rhinitis, unspecified: Secondary | ICD-10-CM | POA: Diagnosis not present

## 2018-06-27 DIAGNOSIS — K219 Gastro-esophageal reflux disease without esophagitis: Secondary | ICD-10-CM | POA: Diagnosis not present

## 2018-06-27 DIAGNOSIS — Z125 Encounter for screening for malignant neoplasm of prostate: Secondary | ICD-10-CM | POA: Diagnosis not present

## 2018-06-27 MED FILL — ATORVASTATIN 10 MG TABLET: 10 | 84 days supply | Qty: 24 | Fill #0

## 2018-06-27 MED FILL — FLUTICASONE PROP 50 MCG SPR: 50 | 60 days supply | Qty: 16 | Fill #0

## 2018-06-27 MED FILL — metFORMIN HCL 500 MG TABS: 500 | 90 days supply | Qty: 270 | Fill #0

## 2018-09-24 MED FILL — metFORMIN HCL 500 MG TABS: 500 | 30 days supply | Qty: 90 | Fill #1

## 2018-11-06 MED FILL — metFORMIN HCL 500 MG TABS: 500 | 60 days supply | Qty: 180 | Fill #0

## 2019-01-30 ENCOUNTER — Other Ambulatory Visit: Payer: Self-pay

## 2019-01-30 DIAGNOSIS — E559 Vitamin D deficiency, unspecified: Secondary | ICD-10-CM | POA: Diagnosis not present

## 2019-01-30 DIAGNOSIS — J309 Allergic rhinitis, unspecified: Secondary | ICD-10-CM | POA: Diagnosis not present

## 2019-01-30 DIAGNOSIS — E291 Testicular hypofunction: Secondary | ICD-10-CM | POA: Diagnosis not present

## 2019-01-30 DIAGNOSIS — R7303 Prediabetes: Secondary | ICD-10-CM | POA: Diagnosis not present

## 2019-01-30 DIAGNOSIS — Z0000 Encounter for general adult medical examination without abnormal findings: Secondary | ICD-10-CM | POA: Diagnosis not present

## 2019-01-30 DIAGNOSIS — E782 Mixed hyperlipidemia: Secondary | ICD-10-CM | POA: Diagnosis not present

## 2019-01-30 DIAGNOSIS — N529 Male erectile dysfunction, unspecified: Secondary | ICD-10-CM | POA: Diagnosis not present

## 2019-01-30 DIAGNOSIS — Z20822 Contact with and (suspected) exposure to covid-19: Secondary | ICD-10-CM

## 2019-01-30 DIAGNOSIS — K219 Gastro-esophageal reflux disease without esophagitis: Secondary | ICD-10-CM | POA: Diagnosis not present

## 2019-01-30 DIAGNOSIS — Z125 Encounter for screening for malignant neoplasm of prostate: Secondary | ICD-10-CM | POA: Diagnosis not present

## 2019-01-30 MED FILL — MELOXICAM 15 MG TABLET: 15 | 30 days supply | Qty: 30 | Fill #0

## 2019-01-30 MED FILL — ATORVASTATIN 10 MG TABLET: 10 | 84 days supply | Qty: 36 | Fill #0

## 2019-01-30 MED FILL — FLUTICASONE PROP 50 MCG SPR: 50 | 60 days supply | Qty: 16 | Fill #0

## 2019-01-30 MED FILL — metFORMIN HCL 500 MG TABS: 500 | 90 days supply | Qty: 270 | Fill #0

## 2019-01-30 MED FILL — TESTOSTERONE 20.25 MG/ACT (: 20.25 MG/AC | 60 days supply | Qty: 75 | Fill #0

## 2019-01-31 LAB — NOVEL CORONAVIRUS, NAA: SARS-CoV-2, NAA: NOT DETECTED

## 2019-03-13 DIAGNOSIS — H11003 Unspecified pterygium of eye, bilateral: Secondary | ICD-10-CM | POA: Diagnosis not present

## 2019-03-13 DIAGNOSIS — E119 Type 2 diabetes mellitus without complications: Secondary | ICD-10-CM | POA: Diagnosis not present

## 2019-03-18 MED FILL — FLUOROMETHOLONE 0.1% DROPS: 0.1 | 50 days supply | Qty: 5 | Fill #0

## 2019-05-07 MED FILL — metFORMIN HCL 500 MG TABS: 500 | 90 days supply | Qty: 270 | Fill #1

## 2019-07-22 ENCOUNTER — Telehealth: Payer: Self-pay

## 2019-10-09 DIAGNOSIS — E559 Vitamin D deficiency, unspecified: Secondary | ICD-10-CM | POA: Diagnosis not present

## 2019-10-09 DIAGNOSIS — N529 Male erectile dysfunction, unspecified: Secondary | ICD-10-CM | POA: Diagnosis not present

## 2019-10-09 DIAGNOSIS — R197 Diarrhea, unspecified: Secondary | ICD-10-CM | POA: Diagnosis not present

## 2019-10-09 DIAGNOSIS — Z125 Encounter for screening for malignant neoplasm of prostate: Secondary | ICD-10-CM | POA: Diagnosis not present

## 2019-10-09 DIAGNOSIS — J309 Allergic rhinitis, unspecified: Secondary | ICD-10-CM | POA: Diagnosis not present

## 2019-10-09 DIAGNOSIS — E291 Testicular hypofunction: Secondary | ICD-10-CM | POA: Diagnosis not present

## 2019-10-09 DIAGNOSIS — K219 Gastro-esophageal reflux disease without esophagitis: Secondary | ICD-10-CM | POA: Diagnosis not present

## 2019-10-09 DIAGNOSIS — E782 Mixed hyperlipidemia: Secondary | ICD-10-CM | POA: Diagnosis not present

## 2019-10-09 DIAGNOSIS — R7303 Prediabetes: Secondary | ICD-10-CM | POA: Diagnosis not present

## 2019-10-12 MED FILL — ATORVASTATIN CALCIUM 10 MG: 10 | 84 days supply | Qty: 12 | Fill #0

## 2019-10-15 ENCOUNTER — Other Ambulatory Visit: Payer: Self-pay | Admitting: Family Medicine

## 2019-10-15 ENCOUNTER — Other Ambulatory Visit (HOSPITAL_COMMUNITY): Payer: Self-pay | Admitting: Family Medicine

## 2019-10-15 DIAGNOSIS — R1011 Right upper quadrant pain: Secondary | ICD-10-CM

## 2019-10-15 DIAGNOSIS — R197 Diarrhea, unspecified: Secondary | ICD-10-CM

## 2019-10-16 ENCOUNTER — Ambulatory Visit (HOSPITAL_COMMUNITY)
Admission: RE | Admit: 2019-10-16 | Discharge: 2019-10-16 | Disposition: A | Discharge status: Home / Self Care | Payer: 59 | Source: Ambulatory Visit | Attending: Family Medicine | Admitting: Family Medicine

## 2019-10-16 DIAGNOSIS — R1011 Right upper quadrant pain: Secondary | ICD-10-CM | POA: Diagnosis present

## 2019-10-16 DIAGNOSIS — R197 Diarrhea, unspecified: Secondary | ICD-10-CM | POA: Diagnosis not present

## 2019-10-16 DIAGNOSIS — K7689 Other specified diseases of liver: Secondary | ICD-10-CM | POA: Diagnosis not present

## 2019-10-19 ENCOUNTER — Other Ambulatory Visit: Payer: Self-pay | Admitting: Family Medicine

## 2019-10-19 DIAGNOSIS — R935 Abnormal findings on diagnostic imaging of other abdominal regions, including retroperitoneum: Secondary | ICD-10-CM

## 2019-10-20 ENCOUNTER — Other Ambulatory Visit: Payer: Self-pay

## 2019-10-20 ENCOUNTER — Ambulatory Visit (HOSPITAL_COMMUNITY)
Admission: RE | Admit: 2019-10-20 | Discharge: 2019-10-20 | Disposition: A | Discharge status: Home / Self Care | Payer: 59 | Source: Ambulatory Visit | Attending: Family Medicine | Admitting: Family Medicine

## 2019-10-20 ENCOUNTER — Other Ambulatory Visit: Payer: Self-pay | Admitting: Family Medicine

## 2019-10-20 ENCOUNTER — Ambulatory Visit (HOSPITAL_COMMUNITY): Payer: 59

## 2019-10-20 DIAGNOSIS — R935 Abnormal findings on diagnostic imaging of other abdominal regions, including retroperitoneum: Secondary | ICD-10-CM

## 2019-10-20 DIAGNOSIS — R1011 Right upper quadrant pain: Secondary | ICD-10-CM | POA: Diagnosis not present

## 2019-10-20 DIAGNOSIS — R7401 Elevation of levels of liver transaminase levels: Secondary | ICD-10-CM | POA: Diagnosis not present

## 2019-10-20 DIAGNOSIS — K760 Fatty (change of) liver, not elsewhere classified: Secondary | ICD-10-CM | POA: Diagnosis not present

## 2019-10-20 MED ORDER — GADOBUTROL 1 MMOL/ML IV SOLN
10.0000 mL | Freq: Once | INTRAVENOUS | Status: AC | PRN
  Administered 2019-10-20: 9 mL via INTRAVENOUS

## 2019-10-21 DIAGNOSIS — R197 Diarrhea, unspecified: Secondary | ICD-10-CM | POA: Diagnosis not present

## 2019-10-23 MED FILL — CIPROFLOXACIN HCL 500 MG TA: 500 | 10 days supply | Qty: 20 | Fill #0

## 2019-10-23 MED FILL — METRONIDAZOLE 500 MG TABS: 500 | 10 days supply | Qty: 30 | Fill #0

## 2019-11-03 MED FILL — SULFAMETHOXAZOLE-TMP DS TAB: 800-160 | 7 days supply | Qty: 14 | Fill #0

## 2019-11-19 DIAGNOSIS — E291 Testicular hypofunction: Secondary | ICD-10-CM | POA: Diagnosis not present

## 2019-12-11 MED FILL — TESTOSTERONE 1.62 % GEL: 1.62 | 60 days supply | Qty: 75 | Fill #1

## 2020-01-11 MED FILL — METFORMIN HCL 500 MG TABS: 500 | 60 days supply | Qty: 180 | Fill #1

## 2020-01-25 ENCOUNTER — Other Ambulatory Visit: Payer: Self-pay

## 2020-03-04 ENCOUNTER — Other Ambulatory Visit (HOSPITAL_COMMUNITY): Payer: Self-pay | Admitting: Gastroenterology

## 2020-04-07 ENCOUNTER — Other Ambulatory Visit (HOSPITAL_COMMUNITY): Payer: Self-pay | Admitting: Family Medicine

## 2020-04-08 ENCOUNTER — Other Ambulatory Visit (HOSPITAL_COMMUNITY): Payer: Self-pay | Admitting: Family Medicine

## 2020-04-08 MED FILL — METFORMIN HCL 500 MG TABS: 500 | 60 days supply | Qty: 180 | Fill #0

## 2020-04-08 MED FILL — TESTOSTERONE 1.62 % GEL: 1.62 | 60 days supply | Qty: 75 | Fill #0

## 2020-04-21 MED FILL — PEG-3350 SOLUTION: 420 | 1 days supply | Qty: 4000 | Fill #0

## 2020-04-27 DIAGNOSIS — K640 First degree hemorrhoids: Secondary | ICD-10-CM | POA: Diagnosis not present

## 2020-04-27 DIAGNOSIS — D120 Benign neoplasm of cecum: Secondary | ICD-10-CM | POA: Diagnosis not present

## 2020-04-27 DIAGNOSIS — Z1211 Encounter for screening for malignant neoplasm of colon: Secondary | ICD-10-CM | POA: Diagnosis not present

## 2020-04-27 DIAGNOSIS — K635 Polyp of colon: Secondary | ICD-10-CM | POA: Diagnosis not present

## 2020-05-19 ENCOUNTER — Other Ambulatory Visit (HOSPITAL_COMMUNITY): Payer: Self-pay | Admitting: Family Medicine

## 2020-05-19 MED FILL — HYDROCORTISONE 2.5% CREAM: 2.5 | 30 days supply | Qty: 30 | Fill #0

## 2020-05-31 ENCOUNTER — Ambulatory Visit (INDEPENDENT_AMBULATORY_CARE_PROVIDER_SITE_OTHER): Payer: 59 | Admitting: Orthopaedic Surgery

## 2020-05-31 ENCOUNTER — Encounter: Payer: Self-pay | Admitting: Orthopaedic Surgery

## 2020-05-31 ENCOUNTER — Ambulatory Visit (INDEPENDENT_AMBULATORY_CARE_PROVIDER_SITE_OTHER): Payer: 59

## 2020-05-31 ENCOUNTER — Other Ambulatory Visit: Payer: Self-pay

## 2020-05-31 VITALS — Ht 66.0 in | Wt 199.0 lb

## 2020-05-31 DIAGNOSIS — M25512 Pain in left shoulder: Secondary | ICD-10-CM | POA: Insufficient documentation

## 2020-05-31 DIAGNOSIS — G8929 Other chronic pain: Secondary | ICD-10-CM

## 2020-05-31 MED ORDER — BUPIVACAINE HCL 0.25 % IJ SOLN
2.0000 mL | INTRAMUSCULAR | Status: AC | PRN
  Administered 2020-05-31: 2 mL via INTRA_ARTICULAR

## 2020-05-31 NOTE — Progress Notes (Signed)
Office Visit Note   Patient: [name] [name]           Date of Birth: 1971-03-28           MRN: [id number] Visit Date: 05/31/2020              Requested by: No referring provider defined for this encounter. PCP: Patient, No Pcp Per   Assessment & Plan: Visit Diagnoses:  1. Chronic left shoulder pain     Plan: Chronic impingement syndrome left shoulder.  This certainly is a possibility of a small rotator cuff tear.  Will inject the subacromial space with betamethasone and monitor response.  Consider MRI scan if no improvement the next 3 to 4 weeks.  Follow-Up Instructions: Return if symptoms worsen or fail to improve.   Orders:  Orders Placed This Encounter  Procedures  . Large Joint Inj: L subacromial bursa  . XR Shoulder Left   No orders of the defined types were placed in this encounter.     Procedures: Large Joint Inj: L subacromial bursa on 05/31/2020 5:15 PM Indications: pain and diagnostic evaluation Details: 25 G 1.5 in needle, anterolateral approach  Arthrogram: No  Medications: 2 mL bupivacaine 0.25 %  12 mg betamethasone injected the subacromial space left shoulder with Marcaine Consent was given by the patient. Immediately prior to procedure a time out was called to verify the correct patient, procedure, equipment, support staff and site/side marked as required. Patient was prepped and draped in the usual sterile fashion.       Clinical Data: No additional findings.   Subjective: Chief Complaint  Patient presents with  . Left Shoulder - Pain  Patient presents today for left shoulder pain. He states that it has been bothering him for about 6 months. He first noticed it while playing golf and also with doing any presses at the gym. He said that this past Saturday he was outside and almost fell into the fire behind him. He caught himself, but had immediate sharp pain for about 20 seconds. Since then he has had limited range of motion and strength. He is  noticing improvement, but still not 100%. He feels better when resting his arm. No numbness or tingling down his arm. He is right hand dominant. He states that he has a history of arthroscopic shoulder surgery with Dr.Whitfield a few years ago. He is not taking anything for pain.  No problems after the arthroscopic debridement (probably 10 years ago)  HPI  Review of Systems   Objective: Vital Signs: Ht 5\' 6"  (1.676 m)   Wt 199 lb (90.3 kg)   BMI 32.12 kg/m   Physical Exam Constitutional:      Appearance: He is well-developed and well-nourished.  HENT:     Mouth/Throat:     Mouth: Oropharynx is clear and moist.  Eyes:     Extraocular Movements: EOM normal.     Pupils: Pupils are equal, round, and reactive to light.  Pulmonary:     Effort: Pulmonary effort is normal.  Skin:    General: Skin is warm and dry.  Neurological:     Mental Status: He is alert and oriented to person, place, and time.  Psychiatric:        Mood and Affect: Mood and affect normal.        Behavior: Behavior normal.     Ortho Exam positive impingement left shoulder on extreme of external rotation.  Full overhead motion that was quick.  Some  discomfort with abduction in the subacromial region.  Mild anterior subacromial pain.  No pain at the [facility] joint.  No grating or crepitation.  Biceps intact.  Good strength  Specialty Comments:  No specialty comments available.  Imaging: XR Shoulder Left  Result Date: 05/31/2020 Films of the left shoulder obtained in several projections and were nondiagnostic for any specific problem.  The humeral head was centered about the glenoid.  Normal space between humeral head and the acromium.  No ectopic calcification.  Minimal  changes at the [facility] joint with a type II acromium    PMFS History: Patient Active Problem List   Diagnosis Date Noted  . Pain in left shoulder 05/31/2020   Past Medical History:  Diagnosis Date  . Arthritis    osteoarthritis    History  reviewed. No pertinent family history.  Past Surgical History:  Procedure Laterality Date  . KNEE ARTHROSCOPY  11/22/2011   Procedure: ARTHROSCOPY KNEE;  Surgeon: Garald Balding, MD;  Location: [place];  Service: Orthopedics;  Laterality: Right;  with debridement synovectomy with excision of fat pad.  . left shoulder  SAD    . right and left carpal tunnel release     Social History   Occupational History  . Not on file  Tobacco Use  . Smoking status: Never Smoker  . Smokeless tobacco: Not on file  Substance and Sexual Activity  . Alcohol use: No  . Drug use: No  . Sexual activity: Not on file

## 2020-08-26 ENCOUNTER — Other Ambulatory Visit (HOSPITAL_COMMUNITY): Payer: Self-pay

## 2020-08-26 DIAGNOSIS — Z125 Encounter for screening for malignant neoplasm of prostate: Secondary | ICD-10-CM | POA: Diagnosis not present

## 2020-08-26 DIAGNOSIS — E669 Obesity, unspecified: Secondary | ICD-10-CM | POA: Diagnosis not present

## 2020-08-26 DIAGNOSIS — E291 Testicular hypofunction: Secondary | ICD-10-CM | POA: Diagnosis not present

## 2020-08-26 DIAGNOSIS — E782 Mixed hyperlipidemia: Secondary | ICD-10-CM | POA: Diagnosis not present

## 2020-08-26 DIAGNOSIS — K219 Gastro-esophageal reflux disease without esophagitis: Secondary | ICD-10-CM | POA: Diagnosis not present

## 2020-08-26 DIAGNOSIS — E559 Vitamin D deficiency, unspecified: Secondary | ICD-10-CM | POA: Diagnosis not present

## 2020-08-26 DIAGNOSIS — R7303 Prediabetes: Secondary | ICD-10-CM | POA: Diagnosis not present

## 2020-08-26 DIAGNOSIS — J309 Allergic rhinitis, unspecified: Secondary | ICD-10-CM | POA: Diagnosis not present

## 2020-08-26 DIAGNOSIS — N529 Male erectile dysfunction, unspecified: Secondary | ICD-10-CM | POA: Diagnosis not present

## 2020-08-26 MED ORDER — METFORMIN HCL 500 MG PO TABS
ORAL_TABLET | ORAL | 5 refills | Status: DC
  Filled 2020-08-26: qty 90, 30d supply, fill #0
  Filled 2020-09-21: qty 90, 30d supply, fill #1
  Filled 2020-10-25: qty 90, 30d supply, fill #2
  Filled 2020-11-18: qty 90, 30d supply, fill #3

## 2020-08-26 MED ORDER — ATORVASTATIN CALCIUM 10 MG PO TABS
ORAL_TABLET | ORAL | 1 refills | Status: DC
  Filled 2020-08-26: qty 12, 90d supply, fill #0

## 2020-08-26 MED ORDER — TESTOSTERONE 20.25 MG/ACT (1.62%) TD GEL
TRANSDERMAL | 5 refills | Status: AC
  Filled 2020-08-26: qty 75, 60d supply, fill #0
  Filled 2020-11-18: qty 75, 60d supply, fill #1

## 2020-08-26 MED ORDER — FLUTICASONE PROPIONATE 50 MCG/ACT NA SUSP
NASAL | 1 refills | Status: AC
  Filled 2020-08-26: qty 16, 60d supply, fill #0
  Filled 2020-11-18: qty 16, 60d supply, fill #1

## 2020-08-26 MED ORDER — SILDENAFIL CITRATE 20 MG PO TABS
ORAL_TABLET | ORAL | 1 refills | Status: AC
  Filled 2020-08-26: qty 90, 90d supply, fill #0

## 2020-09-21 ENCOUNTER — Other Ambulatory Visit (HOSPITAL_COMMUNITY): Payer: Self-pay

## 2020-10-25 ENCOUNTER — Other Ambulatory Visit (HOSPITAL_COMMUNITY): Payer: Self-pay

## 2020-11-18 ENCOUNTER — Other Ambulatory Visit (HOSPITAL_COMMUNITY): Payer: Self-pay

## 2020-12-21 ENCOUNTER — Other Ambulatory Visit (HOSPITAL_COMMUNITY): Payer: Self-pay

## 2020-12-21 MED ORDER — METFORMIN HCL 500 MG PO TABS
ORAL_TABLET | ORAL | 2 refills | Status: DC
  Filled 2020-12-21: qty 60, 20d supply, fill #0
  Filled 2021-01-16: qty 60, 20d supply, fill #1
  Filled 2021-02-13: qty 60, 20d supply, fill #2

## 2021-01-16 ENCOUNTER — Other Ambulatory Visit (HOSPITAL_COMMUNITY): Payer: Self-pay

## 2021-02-13 ENCOUNTER — Other Ambulatory Visit (HOSPITAL_COMMUNITY): Payer: Self-pay

## 2021-02-28 ENCOUNTER — Other Ambulatory Visit (HOSPITAL_COMMUNITY): Payer: Self-pay

## 2021-02-28 DIAGNOSIS — E782 Mixed hyperlipidemia: Secondary | ICD-10-CM | POA: Diagnosis not present

## 2021-02-28 DIAGNOSIS — R7303 Prediabetes: Secondary | ICD-10-CM | POA: Diagnosis not present

## 2021-02-28 DIAGNOSIS — K219 Gastro-esophageal reflux disease without esophagitis: Secondary | ICD-10-CM | POA: Diagnosis not present

## 2021-02-28 DIAGNOSIS — N529 Male erectile dysfunction, unspecified: Secondary | ICD-10-CM | POA: Diagnosis not present

## 2021-02-28 DIAGNOSIS — J309 Allergic rhinitis, unspecified: Secondary | ICD-10-CM | POA: Diagnosis not present

## 2021-02-28 DIAGNOSIS — E559 Vitamin D deficiency, unspecified: Secondary | ICD-10-CM | POA: Diagnosis not present

## 2021-02-28 DIAGNOSIS — E669 Obesity, unspecified: Secondary | ICD-10-CM | POA: Diagnosis not present

## 2021-02-28 DIAGNOSIS — E291 Testicular hypofunction: Secondary | ICD-10-CM | POA: Diagnosis not present

## 2021-02-28 MED ORDER — SILDENAFIL CITRATE 20 MG PO TABS
ORAL_TABLET | ORAL | 1 refills | Status: AC
  Filled 2021-02-28: qty 90, 45d supply, fill #0

## 2021-02-28 MED ORDER — ATORVASTATIN CALCIUM 10 MG PO TABS
ORAL_TABLET | ORAL | 1 refills | Status: DC
  Filled 2021-02-28: qty 13, 90d supply, fill #0

## 2021-02-28 MED ORDER — TESTOSTERONE 20.25 MG/ACT (1.62%) TD GEL
TRANSDERMAL | 5 refills | Status: AC
  Filled 2021-02-28: qty 75, 60d supply, fill #0
  Filled 2021-06-13: qty 75, 60d supply, fill #1

## 2021-02-28 MED ORDER — METFORMIN HCL 500 MG PO TABS
ORAL_TABLET | ORAL | 1 refills | Status: AC
  Filled 2021-02-28: qty 270, 90d supply, fill #0
  Filled 2021-06-13: qty 270, 90d supply, fill #1

## 2021-03-01 ENCOUNTER — Other Ambulatory Visit (HOSPITAL_COMMUNITY): Payer: Self-pay

## 2021-06-13 ENCOUNTER — Other Ambulatory Visit (HOSPITAL_COMMUNITY): Payer: Self-pay

## 2021-10-05 ENCOUNTER — Other Ambulatory Visit (HOSPITAL_COMMUNITY): Payer: Self-pay

## 2021-10-05 DIAGNOSIS — Z125 Encounter for screening for malignant neoplasm of prostate: Secondary | ICD-10-CM | POA: Diagnosis not present

## 2021-10-05 DIAGNOSIS — E669 Obesity, unspecified: Secondary | ICD-10-CM | POA: Diagnosis not present

## 2021-10-05 DIAGNOSIS — Z0000 Encounter for general adult medical examination without abnormal findings: Secondary | ICD-10-CM | POA: Diagnosis not present

## 2021-10-05 DIAGNOSIS — E559 Vitamin D deficiency, unspecified: Secondary | ICD-10-CM | POA: Diagnosis not present

## 2021-10-05 DIAGNOSIS — E782 Mixed hyperlipidemia: Secondary | ICD-10-CM | POA: Diagnosis not present

## 2021-10-05 DIAGNOSIS — K219 Gastro-esophageal reflux disease without esophagitis: Secondary | ICD-10-CM | POA: Diagnosis not present

## 2021-10-05 DIAGNOSIS — R7303 Prediabetes: Secondary | ICD-10-CM | POA: Diagnosis not present

## 2021-10-05 DIAGNOSIS — J309 Allergic rhinitis, unspecified: Secondary | ICD-10-CM | POA: Diagnosis not present

## 2021-10-05 DIAGNOSIS — E291 Testicular hypofunction: Secondary | ICD-10-CM | POA: Diagnosis not present

## 2021-10-05 DIAGNOSIS — N529 Male erectile dysfunction, unspecified: Secondary | ICD-10-CM | POA: Diagnosis not present

## 2021-10-05 MED ORDER — ATORVASTATIN CALCIUM 10 MG PO TABS
ORAL_TABLET | ORAL | 1 refills | Status: DC
  Filled 2021-10-05: qty 13, 90d supply, fill #0

## 2021-10-05 MED ORDER — METFORMIN HCL 500 MG PO TABS
ORAL_TABLET | ORAL | 1 refills | Status: DC
  Filled 2021-10-05: qty 270, 90d supply, fill #0
  Filled 2022-01-01: qty 270, 90d supply, fill #1

## 2021-10-05 MED ORDER — TESTOSTERONE 20.25 MG/ACT (1.62%) TD GEL
TRANSDERMAL | 5 refills | Status: AC
  Filled 2021-10-05: qty 75, 60d supply, fill #0

## 2021-12-06 ENCOUNTER — Other Ambulatory Visit (HOSPITAL_COMMUNITY): Payer: Self-pay

## 2021-12-06 DIAGNOSIS — E669 Obesity, unspecified: Secondary | ICD-10-CM | POA: Diagnosis not present

## 2021-12-06 DIAGNOSIS — R7303 Prediabetes: Secondary | ICD-10-CM | POA: Diagnosis not present

## 2021-12-06 MED ORDER — MOUNJARO 2.5 MG/0.5ML ~~LOC~~ SOAJ
SUBCUTANEOUS | 0 refills | Status: DC
  Filled 2021-12-06: qty 2, 30d supply, fill #0

## 2022-01-01 ENCOUNTER — Other Ambulatory Visit (HOSPITAL_COMMUNITY): Payer: Self-pay

## 2022-01-02 ENCOUNTER — Other Ambulatory Visit (HOSPITAL_COMMUNITY): Payer: Self-pay

## 2022-01-03 ENCOUNTER — Other Ambulatory Visit (HOSPITAL_COMMUNITY): Payer: Self-pay

## 2022-01-03 MED ORDER — MOUNJARO 2.5 MG/0.5ML ~~LOC~~ SOAJ
SUBCUTANEOUS | 1 refills | Status: AC
  Filled 2022-01-03: qty 2, 28d supply, fill #0

## 2022-02-13 ENCOUNTER — Other Ambulatory Visit (HOSPITAL_COMMUNITY): Payer: Self-pay

## 2022-02-13 MED ORDER — ZOSTER VAC RECOMB ADJUVANTED 50 MCG/0.5ML IM SUSR
0.5000 mL | INTRAMUSCULAR | 1 refills | Status: AC
  Filled 2022-02-13: qty 0.5, 1d supply, fill #0

## 2022-02-27 ENCOUNTER — Other Ambulatory Visit (HOSPITAL_COMMUNITY): Payer: Self-pay

## 2022-03-26 ENCOUNTER — Other Ambulatory Visit (HOSPITAL_COMMUNITY): Payer: Self-pay

## 2022-03-27 ENCOUNTER — Other Ambulatory Visit (HOSPITAL_COMMUNITY): Payer: Self-pay

## 2022-03-27 MED ORDER — METFORMIN HCL 500 MG PO TABS
ORAL_TABLET | ORAL | 0 refills | Status: AC
  Filled 2022-03-27: qty 270, 90d supply, fill #0

## 2022-03-29 ENCOUNTER — Other Ambulatory Visit (HOSPITAL_COMMUNITY): Payer: Self-pay

## 2022-03-29 MED ORDER — TESTOSTERONE 20.25 MG/ACT (1.62%) TD GEL
1.0000 | Freq: Every day | TRANSDERMAL | 5 refills | Status: DC
  Filled 2022-03-29: qty 75, 60d supply, fill #0
  Filled 2022-09-21: qty 75, 60d supply, fill #1

## 2022-04-03 ENCOUNTER — Other Ambulatory Visit (HOSPITAL_COMMUNITY): Payer: Self-pay

## 2022-04-19 IMAGING — US US ABDOMEN LIMITED
1 series · 14 of 25 positions shown · non-contrast
Comparison: None.

CLINICAL DATA: Right upper quadrant pain.  Epigastric pain.

EXAM:
ULTRASOUND ABDOMEN LIMITED RIGHT UPPER QUADRANT

[Series 1: us abdomen limited · 14 of 61 slices shown]
[im 1/61]
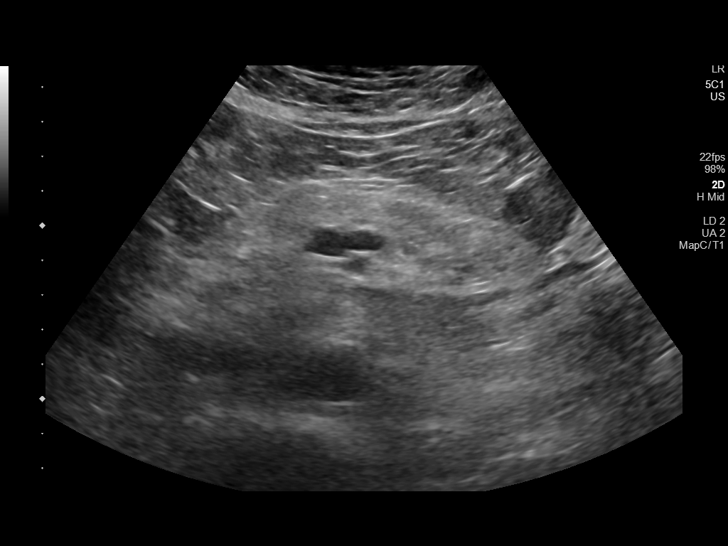
[im 6/61]
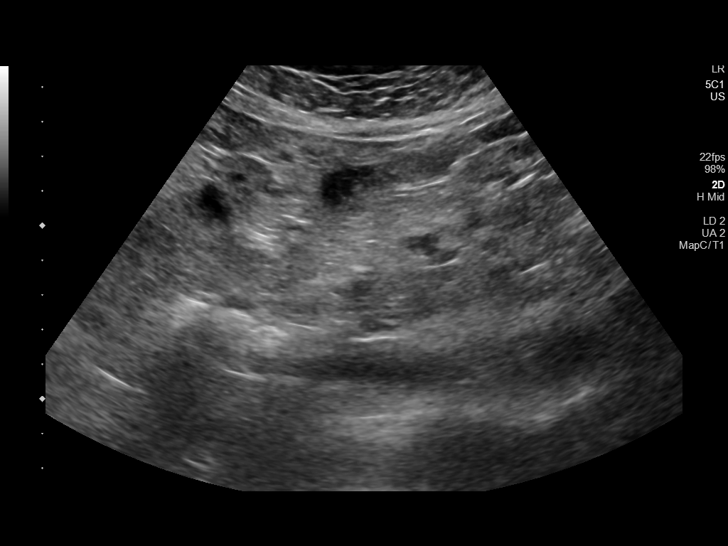
[im 11/61]
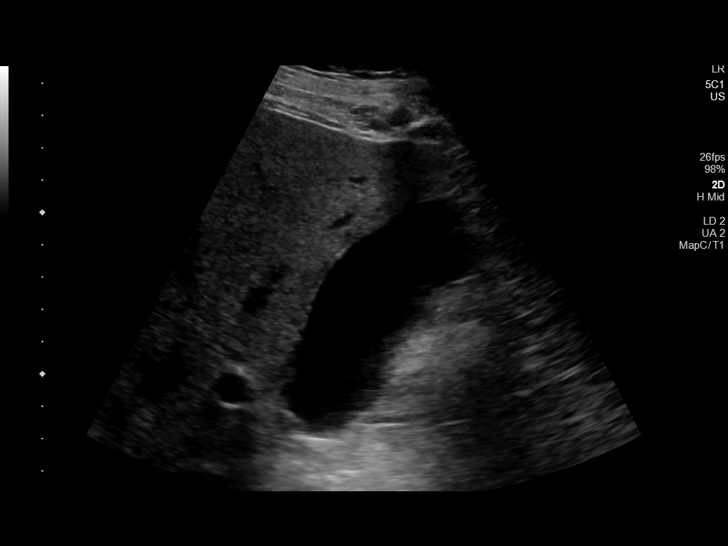
[im 16/61]
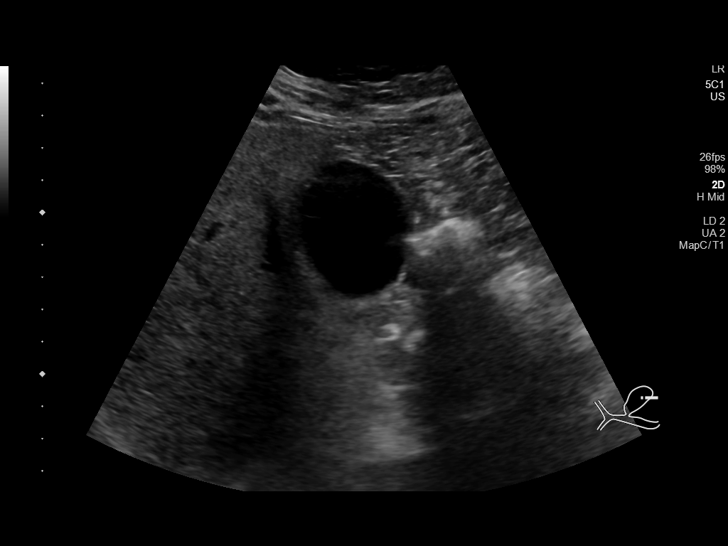
[im 21/61]
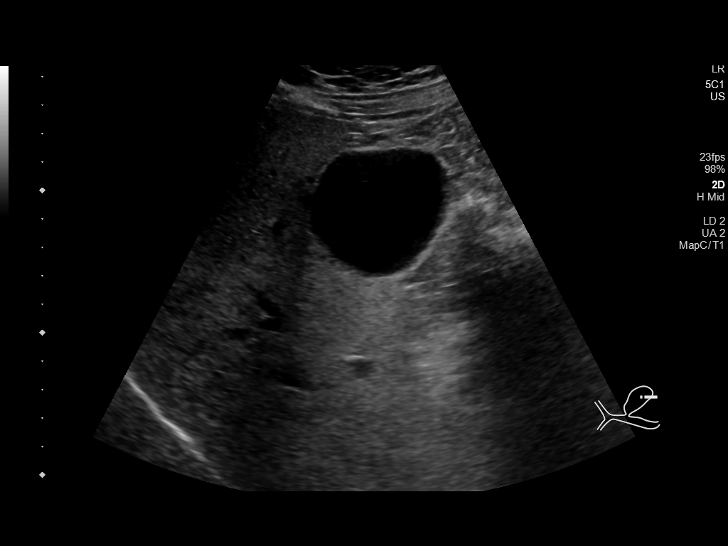
[im 23/61]
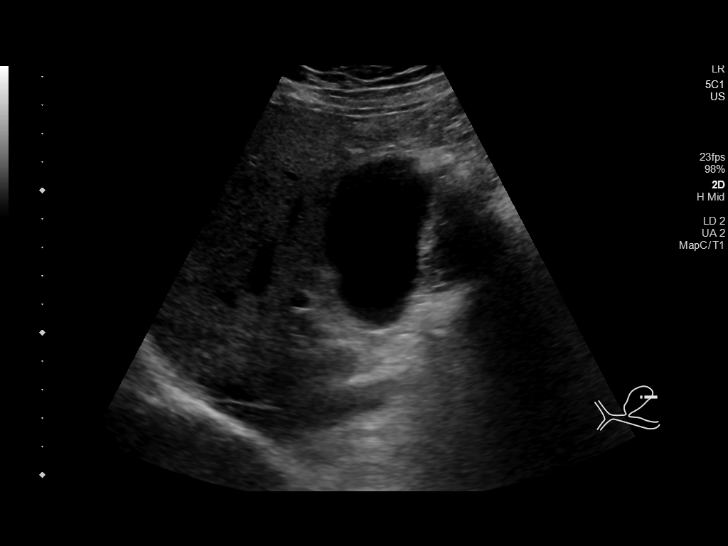
[im 28/61]
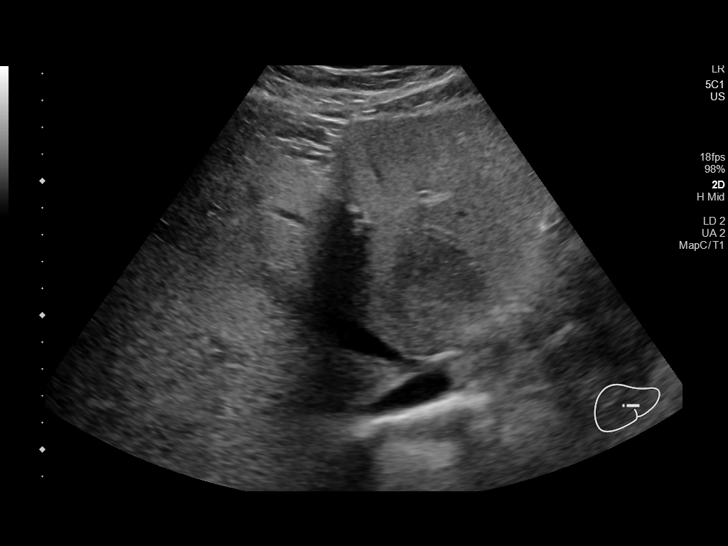
[im 33/61]
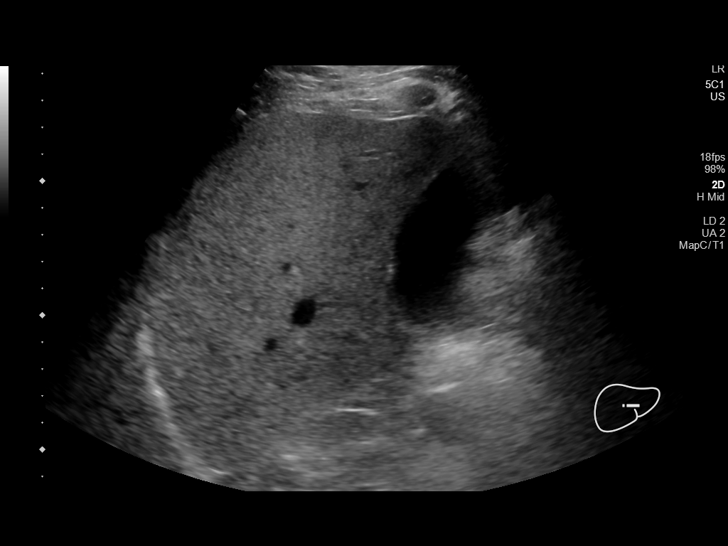
[im 38/61]
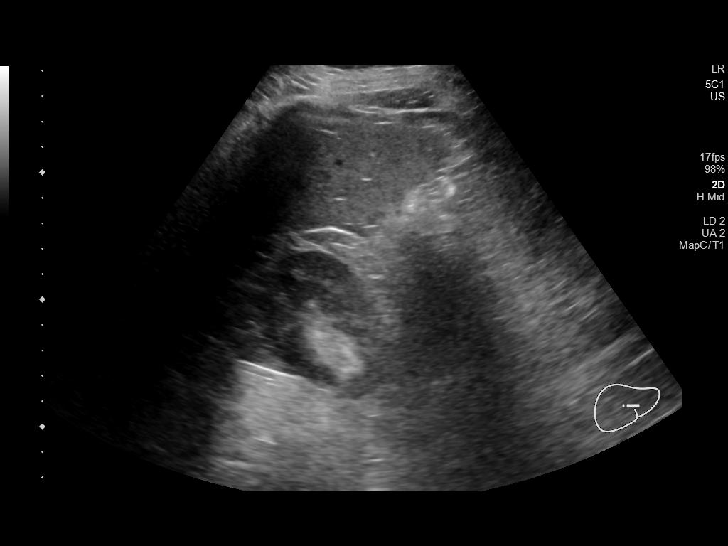
[im 41/61]
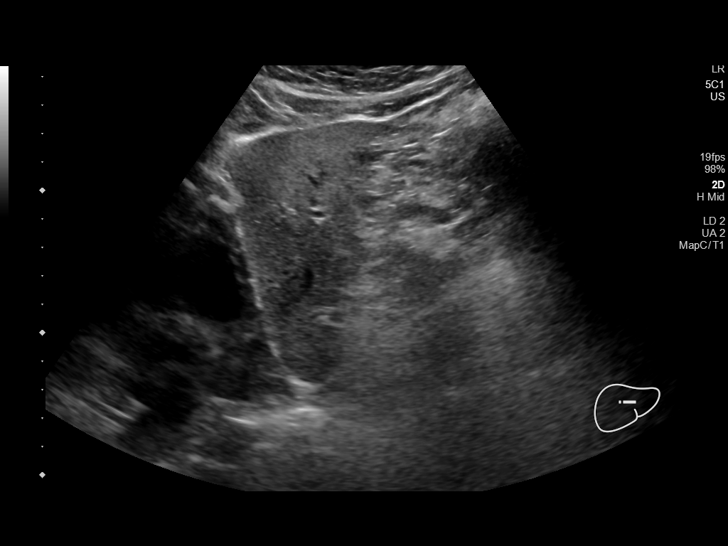
[im 46/61]
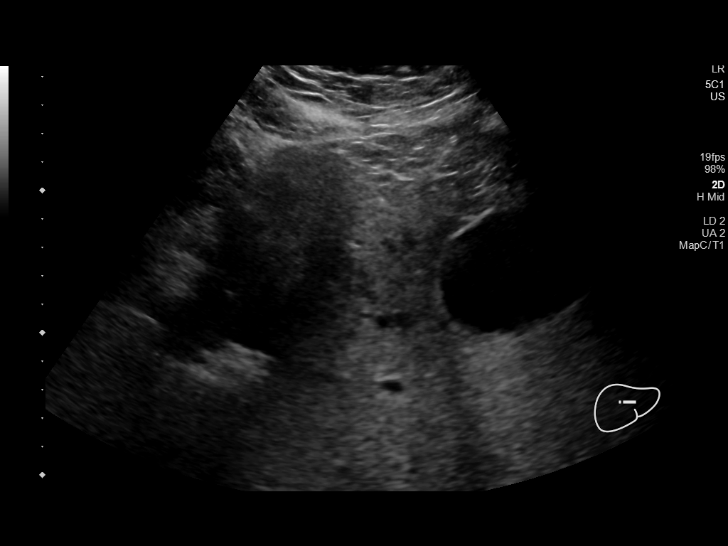
[im 51/61]
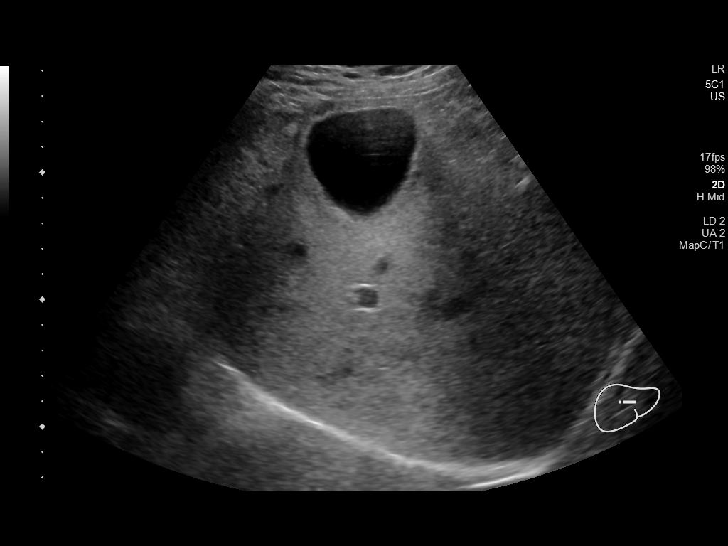
[im 56/61]
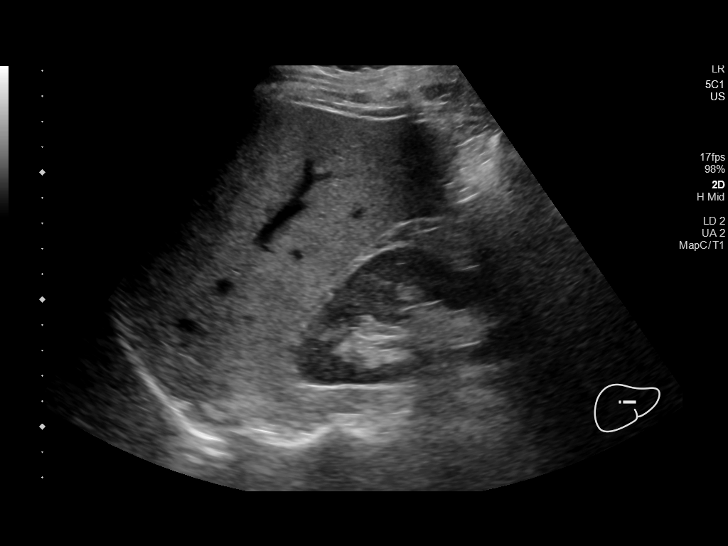
[im 61/61]
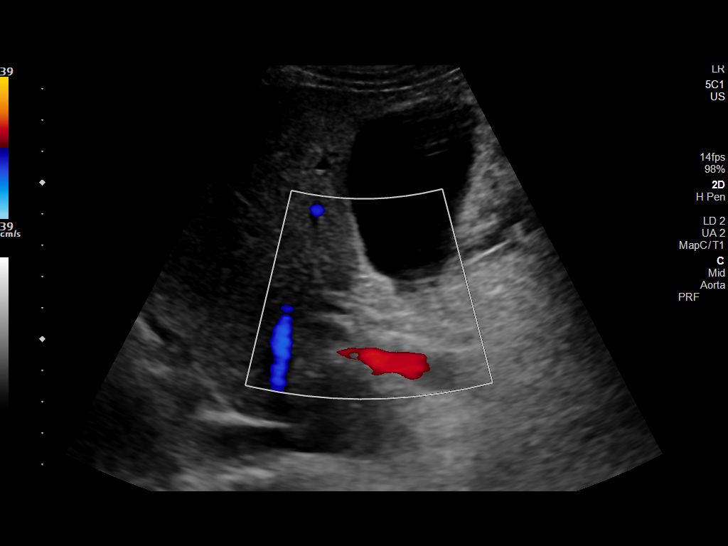

[14 of 25 positions shown; findings below may reference images not displayed]

FINDINGS: Gallbladder:

The gallbladder is distended with no wall thickening,
pericholecystic fluid, stones, sludge, or Murphy's sign.

Common bile duct:

Diameter: 5.5 mm

Liver:

Increased echogenicity with no focal mass. Portal vein is patent on
color Doppler imaging with normal direction of blood flow towards
the liver.

Other: The pancreas is mildly prominent heterogeneous with no focal
mass.
IMPRESSION: 1. The pancreas is heterogeneous and mildly prominent with no
focal/discrete mass. Given appearance and the history of epigastric
pain, recommend MRI for further assessment.
2. The gallbladder is distended with no wall thickening,
pericholecystic fluid, stones, sludge, or Murphy's sign.
3. Increased echogenicity in the liver with no focal mass, likely
hepatic steatosis.

These results will be called to the ordering clinician or
representative by the Radiologist Assistant, and communication
documented in the PACS or [REDACTED].

## 2022-07-20 ENCOUNTER — Other Ambulatory Visit (HOSPITAL_COMMUNITY): Payer: Self-pay

## 2022-07-20 DIAGNOSIS — H6993 Unspecified Eustachian tube disorder, bilateral: Secondary | ICD-10-CM | POA: Diagnosis not present

## 2022-07-20 DIAGNOSIS — Z9622 Myringotomy tube(s) status: Secondary | ICD-10-CM | POA: Diagnosis not present

## 2022-07-20 DIAGNOSIS — H900 Conductive hearing loss, bilateral: Secondary | ICD-10-CM | POA: Diagnosis not present

## 2022-07-20 DIAGNOSIS — Z4589 Encounter for adjustment and management of other implanted devices: Secondary | ICD-10-CM | POA: Diagnosis not present

## 2022-07-20 DIAGNOSIS — H7312 Chronic myringitis, left ear: Secondary | ICD-10-CM | POA: Diagnosis not present

## 2022-07-20 DIAGNOSIS — Z0110 Encounter for examination of ears and hearing without abnormal findings: Secondary | ICD-10-CM | POA: Diagnosis not present

## 2022-07-20 MED ORDER — CIPROFLOXACIN-DEXAMETHASONE 0.3-0.1 % OT SUSP
OTIC | 3 refills | Status: AC
  Filled 2022-07-20: qty 7.5, 7d supply, fill #0

## 2022-07-30 ENCOUNTER — Other Ambulatory Visit (HOSPITAL_COMMUNITY): Payer: Self-pay

## 2022-08-15 ENCOUNTER — Other Ambulatory Visit (HOSPITAL_COMMUNITY): Payer: Self-pay

## 2022-08-15 MED ORDER — NAPROXEN 500 MG PO TABS
500.0000 mg | ORAL_TABLET | Freq: Two times a day (BID) | ORAL | 2 refills | Status: AC
  Filled 2022-08-15: qty 60, 30d supply, fill #0

## 2022-08-15 MED ORDER — METFORMIN HCL 500 MG PO TABS
1000.0000 mg | ORAL_TABLET | Freq: Two times a day (BID) | ORAL | 3 refills | Status: DC
  Filled 2022-08-15: qty 360, 90d supply, fill #0
  Filled 2022-11-11: qty 360, 90d supply, fill #1
  Filled 2023-03-03: qty 360, 90d supply, fill #2
  Filled 2023-06-11: qty 360, 90d supply, fill #3

## 2022-08-15 MED ORDER — OMEPRAZOLE 40 MG PO CPDR: 40.0000 mg | DELAYED_RELEASE_CAPSULE | Freq: Every morning | ORAL | 2 refills | Status: AC

## 2022-08-16 ENCOUNTER — Other Ambulatory Visit (HOSPITAL_COMMUNITY): Payer: Self-pay

## 2022-09-21 ENCOUNTER — Other Ambulatory Visit (HOSPITAL_COMMUNITY): Payer: Self-pay

## 2022-09-29 ENCOUNTER — Other Ambulatory Visit (HOSPITAL_COMMUNITY): Payer: Self-pay

## 2022-10-05 ENCOUNTER — Other Ambulatory Visit (HOSPITAL_COMMUNITY): Payer: Self-pay

## 2022-10-06 ENCOUNTER — Other Ambulatory Visit (HOSPITAL_COMMUNITY): Payer: Self-pay

## 2022-10-06 MED ORDER — TESTOSTERONE 20.25 MG/ACT (1.62%) TD GEL
1.0000 | Freq: Every day | TRANSDERMAL | 0 refills | Status: AC
  Filled 2022-10-06: qty 75, 60d supply, fill #0

## 2022-10-08 ENCOUNTER — Other Ambulatory Visit (HOSPITAL_COMMUNITY): Payer: Self-pay

## 2022-11-09 ENCOUNTER — Other Ambulatory Visit (HOSPITAL_COMMUNITY): Payer: Self-pay

## 2022-11-14 ENCOUNTER — Other Ambulatory Visit (HOSPITAL_COMMUNITY): Payer: Self-pay

## 2023-03-08 ENCOUNTER — Other Ambulatory Visit (HOSPITAL_COMMUNITY): Payer: Self-pay

## 2023-05-08 ENCOUNTER — Encounter: Payer: Self-pay | Admitting: Orthopaedic Surgery

## 2023-07-10 ENCOUNTER — Other Ambulatory Visit (HOSPITAL_COMMUNITY): Payer: Self-pay

## 2023-07-10 DIAGNOSIS — E669 Obesity, unspecified: Secondary | ICD-10-CM | POA: Diagnosis not present

## 2023-07-10 DIAGNOSIS — K219 Gastro-esophageal reflux disease without esophagitis: Secondary | ICD-10-CM | POA: Diagnosis not present

## 2023-07-10 DIAGNOSIS — E291 Testicular hypofunction: Secondary | ICD-10-CM | POA: Diagnosis not present

## 2023-07-10 DIAGNOSIS — E559 Vitamin D deficiency, unspecified: Secondary | ICD-10-CM | POA: Diagnosis not present

## 2023-07-10 DIAGNOSIS — N529 Male erectile dysfunction, unspecified: Secondary | ICD-10-CM | POA: Diagnosis not present

## 2023-07-10 DIAGNOSIS — R7303 Prediabetes: Secondary | ICD-10-CM | POA: Diagnosis not present

## 2023-07-10 DIAGNOSIS — E782 Mixed hyperlipidemia: Secondary | ICD-10-CM | POA: Diagnosis not present

## 2023-07-10 DIAGNOSIS — J309 Allergic rhinitis, unspecified: Secondary | ICD-10-CM | POA: Diagnosis not present

## 2023-07-10 MED ORDER — TESTOSTERONE 20.25 MG/ACT (1.62%) TD GEL
Freq: Every day | TRANSDERMAL | 5 refills | Status: DC
  Filled 2023-07-10: qty 75, 60d supply, fill #0
  Filled 2023-09-13: qty 75, 60d supply, fill #1
  Filled 2023-12-10: qty 75, 60d supply, fill #2

## 2023-07-10 MED ORDER — METFORMIN HCL 500 MG PO TABS
ORAL_TABLET | ORAL | 1 refills | Status: DC
  Filled 2023-09-13: qty 270, 90d supply, fill #0
  Filled 2023-12-10: qty 270, 90d supply, fill #1

## 2023-07-12 ENCOUNTER — Other Ambulatory Visit (HOSPITAL_COMMUNITY): Payer: Self-pay

## 2023-07-12 MED ORDER — ATORVASTATIN CALCIUM 10 MG PO TABS
10.0000 mg | ORAL_TABLET | ORAL | 1 refills | Status: DC
  Filled 2023-07-12: qty 13, 90d supply, fill #0
  Filled 2023-09-13 – 2023-11-13 (×2): qty 13, 90d supply, fill #1

## 2023-09-13 ENCOUNTER — Other Ambulatory Visit: Payer: Self-pay

## 2023-09-13 ENCOUNTER — Other Ambulatory Visit (HOSPITAL_COMMUNITY): Payer: Self-pay

## 2023-09-18 ENCOUNTER — Other Ambulatory Visit: Payer: Self-pay

## 2023-10-15 DIAGNOSIS — E782 Mixed hyperlipidemia: Secondary | ICD-10-CM | POA: Diagnosis not present

## 2023-11-13 ENCOUNTER — Other Ambulatory Visit: Payer: Self-pay

## 2023-12-10 ENCOUNTER — Other Ambulatory Visit (HOSPITAL_COMMUNITY): Payer: Self-pay

## 2023-12-11 ENCOUNTER — Other Ambulatory Visit: Payer: Self-pay

## 2023-12-11 ENCOUNTER — Other Ambulatory Visit (HOSPITAL_COMMUNITY): Payer: Self-pay

## 2023-12-11 MED ORDER — ATORVASTATIN CALCIUM 10 MG PO TABS: 10.0000 mg | ORAL_TABLET | ORAL | 1 refills | Status: DC

## 2024-02-05 ENCOUNTER — Other Ambulatory Visit (HOSPITAL_BASED_OUTPATIENT_CLINIC_OR_DEPARTMENT_OTHER): Payer: Self-pay | Admitting: Family Medicine

## 2024-02-05 ENCOUNTER — Other Ambulatory Visit (HOSPITAL_COMMUNITY): Payer: Self-pay

## 2024-02-05 DIAGNOSIS — E559 Vitamin D deficiency, unspecified: Secondary | ICD-10-CM | POA: Diagnosis not present

## 2024-02-05 DIAGNOSIS — Z9189 Other specified personal risk factors, not elsewhere classified: Secondary | ICD-10-CM

## 2024-02-05 DIAGNOSIS — E669 Obesity, unspecified: Secondary | ICD-10-CM | POA: Diagnosis not present

## 2024-02-05 DIAGNOSIS — K219 Gastro-esophageal reflux disease without esophagitis: Secondary | ICD-10-CM | POA: Diagnosis not present

## 2024-02-05 DIAGNOSIS — E782 Mixed hyperlipidemia: Secondary | ICD-10-CM | POA: Diagnosis not present

## 2024-02-05 DIAGNOSIS — E291 Testicular hypofunction: Secondary | ICD-10-CM | POA: Diagnosis not present

## 2024-02-05 DIAGNOSIS — R7303 Prediabetes: Secondary | ICD-10-CM | POA: Diagnosis not present

## 2024-02-05 DIAGNOSIS — J309 Allergic rhinitis, unspecified: Secondary | ICD-10-CM | POA: Diagnosis not present

## 2024-02-05 DIAGNOSIS — N529 Male erectile dysfunction, unspecified: Secondary | ICD-10-CM | POA: Diagnosis not present

## 2024-02-05 DIAGNOSIS — Z0000 Encounter for general adult medical examination without abnormal findings: Secondary | ICD-10-CM | POA: Diagnosis not present

## 2024-02-05 MED ORDER — TESTOSTERONE 20.25 MG/ACT (1.62%) TD GEL
1.0000 | Freq: Every day | TRANSDERMAL | 5 refills | Status: AC
  Filled 2024-02-05: qty 75, 60d supply, fill #0

## 2024-02-05 MED ORDER — ATORVASTATIN CALCIUM 10 MG PO TABS
10.0000 mg | ORAL_TABLET | ORAL | 1 refills | Status: DC
  Filled 2024-02-05: qty 12, 84d supply, fill #0

## 2024-02-05 MED ORDER — METFORMIN HCL 500 MG PO TABS
ORAL_TABLET | ORAL | 1 refills | Status: AC
  Filled 2024-02-05 – 2024-03-14 (×2): qty 270, 90d supply, fill #0

## 2024-02-10 ENCOUNTER — Other Ambulatory Visit (HOSPITAL_COMMUNITY): Payer: Self-pay

## 2024-02-11 ENCOUNTER — Other Ambulatory Visit: Payer: Self-pay

## 2024-02-27 ENCOUNTER — Ambulatory Visit (HOSPITAL_BASED_OUTPATIENT_CLINIC_OR_DEPARTMENT_OTHER)
Admission: RE | Admit: 2024-02-27 | Discharge: 2024-02-27 | Disposition: A | Discharge status: Home / Self Care | Payer: Self-pay | Source: Ambulatory Visit | Attending: Family Medicine | Admitting: Family Medicine

## 2024-02-27 DIAGNOSIS — Z9189 Other specified personal risk factors, not elsewhere classified: Secondary | ICD-10-CM | POA: Insufficient documentation

## 2024-02-28 ENCOUNTER — Other Ambulatory Visit (HOSPITAL_COMMUNITY): Payer: Self-pay

## 2024-02-28 MED ORDER — ATORVASTATIN CALCIUM 10 MG PO TABS
10.0000 mg | ORAL_TABLET | Freq: Every day | ORAL | 1 refills | Status: DC
  Filled 2024-02-28: qty 90, 90d supply, fill #0

## 2024-03-02 ENCOUNTER — Encounter: Payer: Self-pay | Admitting: Radiology

## 2024-03-12 DIAGNOSIS — H52203 Unspecified astigmatism, bilateral: Secondary | ICD-10-CM | POA: Diagnosis not present

## 2024-03-14 ENCOUNTER — Other Ambulatory Visit (HOSPITAL_COMMUNITY): Payer: Self-pay

## 2024-04-03 ENCOUNTER — Other Ambulatory Visit: Payer: Self-pay

## 2024-04-03 ENCOUNTER — Emergency Department (HOSPITAL_COMMUNITY)
Admission: EM | Admit: 2024-04-03 | Discharge: 2024-04-03 | Disposition: A | Discharge status: Home / Self Care | Attending: Emergency Medicine | Admitting: Emergency Medicine

## 2024-04-03 ENCOUNTER — Emergency Department (HOSPITAL_COMMUNITY)

## 2024-04-03 DIAGNOSIS — R5383 Other fatigue: Secondary | ICD-10-CM | POA: Insufficient documentation

## 2024-04-03 DIAGNOSIS — R0789 Other chest pain: Secondary | ICD-10-CM | POA: Diagnosis not present

## 2024-04-03 DIAGNOSIS — R079 Chest pain, unspecified: Secondary | ICD-10-CM | POA: Diagnosis not present

## 2024-04-03 DIAGNOSIS — R0602 Shortness of breath: Secondary | ICD-10-CM | POA: Diagnosis not present

## 2024-04-03 LAB — CBC WITH DIFFERENTIAL/PLATELET
Abs Immature Granulocytes: 0.04 K/uL (ref 0.00–0.07)
Basophils Absolute: 0 K/uL (ref 0.0–0.1)
Basophils Relative: 0 %
Eosinophils Absolute: 0.1 K/uL (ref 0.0–0.5)
Eosinophils Relative: 2 %
HCT: 44.2 % (ref 39.0–52.0)
Hemoglobin: 14.2 g/dL (ref 13.0–17.0)
Immature Granulocytes: 1 %
Lymphocytes Relative: 29 %
Lymphs Abs: 2 K/uL (ref 0.7–4.0)
MCH: 26.9 pg (ref 26.0–34.0)
MCHC: 32.1 g/dL (ref 30.0–36.0)
MCV: 83.9 fL (ref 80.0–100.0)
Monocytes Absolute: 0.6 K/uL (ref 0.1–1.0)
Monocytes Relative: 8 %
Neutro Abs: 4.3 K/uL (ref 1.7–7.7)
Neutrophils Relative %: 60 %
Platelets: 270 K/uL (ref 150–400)
RBC: 5.27 MIL/uL (ref 4.22–5.81)
RDW: 12.8 % (ref 11.5–15.5)
WBC: 7 K/uL (ref 4.0–10.5)
nRBC: 0 % (ref 0.0–0.2)

## 2024-04-03 LAB — COMPREHENSIVE METABOLIC PANEL WITH GFR
ALT: 18 U/L (ref 0–44)
AST: 20 U/L (ref 15–41)
Albumin: 4 g/dL (ref 3.5–5.0)
Alkaline Phosphatase: 59 U/L (ref 38–126)
Anion gap: 10 (ref 5–15)
BUN: 14 mg/dL (ref 6–20)
CO2: 23 mmol/L (ref 22–32)
Calcium: 8.7 mg/dL — ABNORMAL LOW (ref 8.9–10.3)
Chloride: 104 mmol/L (ref 98–111)
Creatinine, Ser: 0.88 mg/dL (ref 0.61–1.24)
GFR, Estimated: >60 mL/min (ref >60)
Glucose, Bld: 107 mg/dL — ABNORMAL HIGH (ref 70–99)
Potassium: 4.3 mmol/L (ref 3.5–5.1)
Sodium: 137 mmol/L (ref 135–145)
Total Bilirubin: 0.7 mg/dL (ref 0.0–1.2)
Total Protein: 7 g/dL (ref 6.5–8.1)

## 2024-04-03 LAB — TROPONIN I (HIGH SENSITIVITY)
Troponin I (High Sensitivity): 3 ng/L (ref <18)
Troponin I (High Sensitivity): 5 ng/L (ref <18)

## 2024-04-03 LAB — MAGNESIUM: Magnesium: 1.9 mg/dL (ref 1.7–2.4)

## 2024-04-03 LAB — BRAIN NATRIURETIC PEPTIDE: B Natriuretic Peptide: 12.4 pg/mL (ref 0.0–100.0)

## 2024-04-03 MED ORDER — ASPIRIN 81 MG PO CHEW
243.0000 mg | CHEWABLE_TABLET | Freq: Once | ORAL | Status: AC
  Administered 2024-04-03: 243 mg via ORAL
  Filled 2024-04-03: qty 3

## 2024-04-03 NOTE — ED Notes (Signed)
 PT to etc from upstairs with co sob and chest pain while going up the stairs. PER pt he started to have issues with chest pain while exercising back in October. PT was here for a work meeting. PT is a production designer, theatre/television/film at [employer]. PT breathing is even and unlabored. PT is alert.

## 2024-04-03 NOTE — ED Notes (Signed)
 CCMD contacted for monitoring

## 2024-04-03 NOTE — ED Notes (Signed)
 Iv removed

## 2024-04-03 NOTE — ED Notes (Signed)
 PER pt a gel cap or two here and there does not bother his stomach.

## 2024-04-03 NOTE — ED Notes (Signed)
 Breathing is even and unlabored. PT alert and oriented.  Vitals cycling. Blood work sent.

## 2024-04-03 NOTE — ED Provider Notes (Signed)
 [place] [facility] DE[facility]MENT AT [facility] Provider Note   CSN: [id number] Arrival date & time: 04/03/24  1107     Patient presents with: Chest Pain   [name] [name] is a 53 y.o. male.  With a history of hyperlipidemia and elevated calcium  score presents to the ED for chest pain.  Going back to this past September patient has experienced 2-3 separate episodes of chest pain and shortness of breath associated with exertion.  He now feels fatigued even when climbing 1 flight of stairs which is unusual for him.  Chest tightness today when walking a short distance from the parking lot.  No active chest pain at rest.  Denies nausea vomiting diaphoresis fevers chills recent illness.  no history of tobacco use   HPI     Prior to Admission medications   Medication Sig Start Date End Date Taking? Authorizing Provider  atorvastatin  (LIPITOR) 10 MG tablet Take 10 mg by mouth daily.    [provider]  atorvastatin  (LIPITOR) 10 MG tablet Take 1 tablet by  mouth once weekly 08/26/20     atorvastatin  (LIPITOR) 10 MG tablet Take one tablet by mouth once a week. 02/28/21     atorvastatin  (LIPITOR) 10 MG tablet Take 1 tablet by mouth once a week 10/05/21     atorvastatin  (LIPITOR) 10 MG tablet Take 1 tablet (10 mg total) by mouth once a week. 12/11/23     atorvastatin  (LIPITOR) 10 MG tablet Take 1 tablet (10 mg total) by mouth once a week. 02/05/24     atorvastatin  (LIPITOR) 10 MG tablet Take 1 tablet (10 mg total) by mouth daily. 02/28/24     cholecalciferol (VITAMIN D) 1000 UNITS tablet Take 1,000 Units by mouth daily.    [provider]  ciprofloxacin -dexamethasone  (CIPRODEX ) OTIC suspension Administer 4 drops into each ear 2 times a day for 7 days. 07/20/22     fluticasone  (FLONASE ) 50 MCG/ACT nasal spray Place 2 sprays into the nose daily.    [provider]  fluticasone  (FLONASE ) 50 MCG/ACT nasal spray Place 2 sprays into both nostrils daily for 10 days. 09/05/17  09/15/17  Leath-Warren, Etta PARAS, NP  fluticasone  (FLONASE ) 50 MCG/ACT nasal spray Use 1 spray into each nostril once a day 08/26/20     metFORMIN  (GLUCOPHAGE ) 500 MG tablet TAKE 2 TABLETS IN THE MORNING AND 1 TABLET IN THE EVENING WITH MEALS. FOLLOW UP APPOINTMENT NEEDED. 04/07/20 04/07/21  Alben Therisa MATSU, PA  metFORMIN  (GLUCOPHAGE ) 500 MG tablet Take 2 tablets by mouth in the morning and 1 tablet in the evening. 02/28/21     metFORMIN  (GLUCOPHAGE ) 500 MG tablet Take 2 tablets by mouth in the morning and 1 tablet at bedtime as directed 03/27/22     metFORMIN  (GLUCOPHAGE ) 500 MG tablet Take 2 tablets (1,000 mg total) by mouth in the morning AND 1 tablet (500 mg total) at bedtime as directed. 02/05/24     Multiple Vitamin (MULTIVITAMIN) tablet Take 1 tablet by mouth daily.    [provider]  naproxen  (NAPROSYN ) 500 MG tablet Take 1 tablet (500 mg total) by mouth 2 (two) times daily with food for 14 days, then as needed for pain 07/18/22     omeprazole  (PRILOSEC) 40 MG capsule Take 1 capsule (40 mg total) by mouth in the morning. 07/18/22     sildenafil  (REVATIO ) 20 MG tablet Take 2 to 5 tablets by mouth daily as needed 08/26/20     sildenafil  (REVATIO ) 20 MG tablet  Take 2 to 5 tablets by mouth once a day. 02/28/21     Testosterone  (ANDROGEL  PUMP) 20.25 MG/ACT (1.62%) GEL Apply 1 pump to shouler/upper arm once a day as directed. 02/28/21     Testosterone  (ANDROGEL  PUMP) 20.25 MG/ACT (1.62%) GEL Place 1 pump onto the skin once a day 10/05/21     Testosterone  (ANDROGEL  PUMP) 20.25 MG/ACT (1.62%) GEL Place 1 Pump onto the skin daily. 10/05/22     Testosterone  (ANDROGEL  PUMP) 20.25 MG/ACT (1.62%) GEL Place 1 Pump onto the skin daily. 02/05/24     Testosterone  1.62 % GEL APPLY 1 PUMP ONCE A DAY 04/08/20 10/05/20  Alben Therisa MATSU, PA  Testosterone  20.25 MG/ACT (1.62%) GEL Apply 1 pump topically once a day 08/26/20     tirzepatide  (MOUNJARO ) 2.5 MG/0.5ML Pen Inject 2.5mg  into the skin once a week 01/02/22     Zoster  Vaccine Adjuvanted (SHINGRIX ) injection Inject 0.5 mLs into the muscle. 02/13/22   Luiz Channel, MD    Allergies: Erythromycin and Ibuprofen    Review of Systems  Updated Vital Signs BP 117/71   Pulse 75   Temp 98 F (36.7 C) (Oral)   Resp 19   Ht 5' 7 (1.702 m)   Wt 87.1 kg   SpO2 95%   BMI 30.07 kg/m   Physical Exam Vitals and nursing note reviewed.  HENT:     Head: Normocephalic and atraumatic.  Eyes:     Pupils: Pupils are equal, round, and reactive to light.  Cardiovascular:     Rate and Rhythm: Normal rate and regular rhythm.  Pulmonary:     Effort: Pulmonary effort is normal.     Breath sounds: Normal breath sounds.  Abdominal:     Palpations: Abdomen is soft.     Tenderness: There is no abdominal tenderness.  Musculoskeletal:     Right lower leg: No edema.     Left lower leg: No edema.  Skin:    General: Skin is warm and dry.  Neurological:     Mental Status: He is alert.  Psychiatric:        Mood and Affect: Mood normal.     (all labs ordered are listed, but only abnormal results are displayed) Labs Reviewed  COMPREHENSIVE METABOLIC PANEL WITH GFR - Abnormal; Notable for the following components:      Result Value   Glucose, Bld 107 (*)    Calcium  8.7 (*)    All other components within normal limits  BRAIN NATRIURETIC PEPTIDE  CBC WITH DIFFERENTIAL/PLATELET  MAGNESIUM  TROPONIN I (HIGH SENSITIVITY)  TROPONIN I (HIGH SENSITIVITY)    EKG: EKG Interpretation Date/Time:  Friday April 03 2024 11:25:51 EST Ventricular Rate:  75 PR Interval:  155 QRS Duration:  109 QT Interval:  372 QTC Calculation: 416 R Axis:   -18  Text Interpretation: Sinus rhythm Incomplete RBBB and LAFB RSR' in V1 or V2, probably normal variant Confirmed by Pamella Sharper [id number]) on 04/03/2024 3:10:23 PM  Radiology: DG Chest Portable 1 View Result Date: 04/03/2024 EXAM: 1 VIEW(S) XRAY OF THE CHEST 04/03/2024 11:42:16 AM COMPARISON: Chest radiographs 06/30/2008 and  cardiac CT 02/27/2024. CLINICAL HISTORY: 53 year old male. cp sob. FINDINGS: LUNGS AND PLEURA: No focal pulmonary opacity. No pleural effusion. No pneumothorax. HEART AND MEDIASTINUM: No acute abnormality of the cardiac and mediastinal silhouettes. BONES AND SOFT TISSUES: No acute osseous abnormality. IMPRESSION: 1. Negative portable chest. Electronically signed by: Helayne Hurst MD 04/03/2024 12:19 PM EST RP Workstation: [id number]  Procedures   Medications Ordered in the ED  aspirin  chewable tablet 243 mg (243 mg Oral Given 04/03/24 1127)    Clinical Course as of 04/03/24 1516  Fri Apr 03, 2024  1514 Initial troponin and delta troponin negative.  Metabolic panel CBC otherwise unremarkable.  Chest x-ray looks clear no evidence of dysrhythmia or ischemic changes on EKG.  Patient has remained chest pain-free at this time.  Will provide him with cardiology referral.  Return precautions discussed in detail. [MP]    Clinical Course User Index [MP] Pamella Ozell LABOR, DO                                 Medical Decision Making 53 year old male with history as above presented to the ED given concern for exertional chest pain shortness of breath episodes over the last couple months.  First episode was when he was chopping wood with an axe but now had an episode today after walking short distance in the parking lot.  Regarding potential ACS, he certainly has some risk factors with an elevated calcium  score hyperlipidemia prediabetes.  Heart score detailed below.  Will obtain cardiac workup here today including labs high-sensitivity troponin EKG chest x-ray.  If workup is unremarkable overall will provide internal cardiology referral.  HEART Score for Major Cardiac Events from [email or web address] on 04/03/2024 ** All calculations should be rechecked by clinician prior to use **  RESULT SUMMARY: 3 points Low Score (0-3 points)  Risk of MACE of 0.9-1.7%.   INPUTS: History -> 0 = Slightly suspicious EKG ->  0 = Normal Age -> 1 = 45-64 Risk factors -> 2 = >=3 risk factors or history of atherosclerotic disease Initial troponin -> 0 = <=normal limit   Amount and/or Complexity of Data Reviewed Labs: ordered. Radiology: ordered.  Risk OTC drugs.        Final diagnoses:  Chest pain, unspecified type    ED Discharge Orders          Ordered    Ambulatory referral to Cardiology       Comments: If you have not heard from the Cardiology office within the next 72 hours please call [phone].   04/03/24 1515               Pamella Ozell LABOR, DO 04/03/24 1516

## 2024-04-03 NOTE — ED Notes (Signed)
 Patient discharged to home,  breathing is even and unlabored. Skin warm,dry, and natural in color. PT educated on follow up.  PT to follow up as directed. Denies any further questions at this time.

## 2024-04-03 NOTE — Discharge Instructions (Signed)
 You were seen in the emergency department for chest pain Your EKG chest x-ray and blood work all looked okay There is no evidence of heart attack today but with your risk factors and history of exertional chest pain it is important that you follow-up with cardiology in the office We have provided a referral and they should be calling you to schedule an appointment Return to the emergency room for chest pain trouble breathing or any other concerns

## 2024-04-06 ENCOUNTER — Other Ambulatory Visit: Payer: Self-pay | Admitting: *Deleted

## 2024-04-06 ENCOUNTER — Emergency Department: Admitting: Cardiology

## 2024-04-06 ENCOUNTER — Encounter: Payer: Self-pay | Admitting: Cardiology

## 2024-04-06 ENCOUNTER — Other Ambulatory Visit (HOSPITAL_COMMUNITY): Payer: Self-pay

## 2024-04-06 VITALS — BP 120/68 | HR 84 | Ht 67.0 in | Wt 196.6 lb

## 2024-04-06 DIAGNOSIS — Z79899 Other long term (current) drug therapy: Secondary | ICD-10-CM | POA: Diagnosis not present

## 2024-04-06 DIAGNOSIS — E785 Hyperlipidemia, unspecified: Secondary | ICD-10-CM | POA: Diagnosis not present

## 2024-04-06 DIAGNOSIS — R072 Precordial pain: Secondary | ICD-10-CM

## 2024-04-06 MED ORDER — METOPROLOL TARTRATE 50 MG PO TABS
50.0000 mg | ORAL_TABLET | ORAL | 0 refills | Status: AC
  Filled 2024-04-06: qty 1, 1d supply, fill #0

## 2024-04-06 MED ORDER — ROSUVASTATIN CALCIUM 10 MG PO TABS
10.0000 mg | ORAL_TABLET | Freq: Every day | ORAL | 3 refills | Status: AC
  Filled 2024-04-06: qty 90, 90d supply, fill #0

## 2024-04-06 NOTE — Patient Instructions (Addendum)
 Medication Instructions:  Please discontinue your Atorvastatin  and start Rosuvastatin  10 mg once a day. Continue all other medications as listed.  *If you need a refill on your cardiac medications before your next appointment, please call your pharmacy*  Lab: Please have blood work in 3 months (Lipid, LPa).  Testing/Procedures:   Your cardiac CT will be scheduled at:  [facility] [address]  [place], [place] [address]  Please enter the parking lot using the [address] entrance and use the FREE valet service at the patient drop-off area. Enter the building and check-in with registration on the main floor.  Please follow these instructions carefully (unless otherwise directed):  An IV will be required for this test and Nitroglycerin will be given.  Hold all erectile dysfunction medications at least 3 days (72 hrs) prior to test. (Ie viagra , cialis, sildenafil , tadalafil, etc)   On the Night Before the Test: Be sure to Drink plenty of water. Do not consume any caffeinated/decaffeinated beverages or chocolate 12 hours prior to your test. Do not take any antihistamines 12 hours prior to your test.  On the Day of the Test: Drink plenty of water until 1 hour prior to the test. Do not eat any food 1 hour prior to test. You may take your regular medications prior to the test.  Take metoprolol  (Lopressor ) two hours prior to test. If you take Furosemide/Hydrochlorothiazide/Spironolactone/Chlorthalidone, please HOLD on the morning of the test. Patients who wear a continuous glucose monitor MUST remove the device prior to scanning.  After the Test: Drink plenty of water. After receiving IV contrast, you may experience a mild flushed feeling. This is normal. On occasion, you may experience a mild rash up to 24 hours after the test. This is not dangerous. If this occurs, you can take Benadryl 25 mg, Zyrtec, Claritin, or Allegra and increase your fluid  intake. (Patients taking Tikosyn should avoid Benadryl, and may take Zyrtec, Claritin, or Allegra) If you experience trouble breathing, this can be serious. If it is severe call 911 IMMEDIATELY. If it is mild, please call our office.  We will call to schedule your test 2-4 weeks out understanding that some insurance companies will need an authorization prior to the service being performed.   For more information and frequently asked questions, please visit our website : [email or web address]  For non-scheduling related questions, please contact the cardiac imaging nurse navigator should you have any questions/concerns: Cardiac Imaging Nurse Navigators Direct Office Dial: [phone]   For scheduling needs, including cancellations and rescheduling, please call Brittany, [phone].  Follow-Up: At [facility], you and your health needs are our priority.  As part of our continuing mission to provide you with exceptional heart care, our providers are all part of one team.  This team includes your primary Cardiologist (physician) and Advanced Practice Providers or APPs (Physician Assistants and Nurse Practitioners) who all work together to provide you with the care you need, when you need it.  Your next appointment:   Follow up will be based on the results of the above testing.   We recommend signing up for the patient portal called MyChart.  Sign up information is provided on this After Visit Summary.  MyChart is used to connect with patients for Virtual Visits (Telemedicine).  Patients are able to view lab/test results, encounter notes, upcoming appointments, etc.  Non-urgent messages can be sent to your provider as well.   To learn more about what you can do  with MyChart, go to [email or web address].

## 2024-04-06 NOTE — Progress Notes (Signed)
 Cardiology Office Note:  .   Date:  04/06/2024  ID:  [name] [name], DOB Dec 31, 1970, MRN [id number] PCP: Alben Therisa MATSU, PA  [facility] Providers Cardiologist:  None     History of Present Illness: .   [name] [name] is a 53 y.o. male Discussed the use of AI scribe   History of Present Illness [name] [name] is a 52 year old male with prediabetes and hyperlipidemia who presents with chest pain and shortness of breath.  In late September, he experienced a severe episode of shortness of breath, sweating, nausea, and near syncope while chopping a tree. During this event, he also had an episode of incontinence. He managed to return inside, took a shower, and fell asleep. A few days later, after a workout, he experienced intense chest pain in the middle of his chest lasting about 15 minutes, accompanied by sweating and dizziness. He considered calling for help but waited as the symptoms began to ease off.  Since these episodes, he notes a progressive decline in his condition, feeling weak and easily winded, particularly when climbing stairs. He describes himself as active, working out almost daily, but now feels something is not right. He has not felt well enough to engage in activities he used to enjoy, such as chopping trees.  He has a history of prediabetes with HbA1c levels ranging from 5.9 to 6.3 over the past ten years. He also has hyperlipidemia, with a recent LDL level of 124 mg/dL while on atorvastatin  10 mg daily. He started taking aspirin  about a month and a half ago when his symptoms began. He had a calcium  score done previously, but he is unsure of the results.  He denies smoking and has a family history of heart disease, with his grandfather having had a heart attack and his mother having a heart attack at age 19.   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        Results LABS Troponin: Normal Hemoglobin: 14 g/dL Creatinine: 9.11 mg/dL BNP: 12 pg/mL LDL: 875  mg/dL  DIAGNOSTIC EKG: Normal, no ischemic changes Risk Assessment/Calculations:            Physical Exam:   VS:  BP 120/68 (BP Location: Left Arm, Patient Position: Sitting, Cuff Size: Large)   Pulse 84   Ht 5' 7 (1.702 m)   Wt 196 lb 9.6 oz (89.2 kg)   SpO2 96%   BMI 30.79 kg/m    Wt Readings from Last 3 Encounters:  04/06/24 196 lb 9.6 oz (89.2 kg)  04/03/24 192 lb (87.1 kg)  05/31/20 199 lb (90.3 kg)    GEN: Well nourished, well developed in no acute distress NECK: No JVD; No carotid bruits CARDIAC: RRR, no murmurs, no rubs, no gallops RESPIRATORY:  Clear to auscultation without rales, wheezing or rhonchi  ABDOMEN: Soft, non-tender, non-distended EXTREMITIES:  No edema; No deformity   ASSESSMENT AND PLAN: .    Assessment and Plan Assessment & Plan Chest pain and exertional dyspnea, evaluation for cardiac etiology Intermittent chest pain and exertional dyspnea since late September, with episodes of severe shortness of breath, sweating, nausea, and near syncope. Symptoms suggestive of possible cardiac etiology, potentially coronary artery disease. Normal EKG and troponins, but symptoms warrant further investigation. Differential includes coronary artery stenosis. - Ordered coronary CT scan with contrast to evaluate for coronary artery stenosis. - Ordered echocardiogram to assess cardiac structure and function. - Prescribed metoprolol  50 mg for symptomatic relief. - Advised  to avoid strenuous activities that may exacerbate symptoms.  Hyperlipidemia LDL cholesterol at 124 mg/dL on atorvastatin  10 mg daily. Goal is to reduce LDL to less than 70 mg/dL. Discussed potential switch to rosuvastatin  (Crestor ) 10 mg for better efficacy. Consideration of adding ezetimibe if further reduction is needed. Discussed alternative therapies such as Repatha if oral medications are not tolerated. - Switched atorvastatin  10 mg to rosuvastatin  (Crestor ) 10 mg daily. Slightly more potent. Did  not tolerate higher doses.  - Will recheck lipid panel in three months. - Will consider adding ezetimibe if LDL goals are not met. - Discussed potential use of Repatha if oral medications are not tolerated. - ER precautions         Dispo: Will follow up with results of testing  Signed, Oneil Parchment, MD

## 2024-04-07 ENCOUNTER — Other Ambulatory Visit (HOSPITAL_COMMUNITY): Payer: Self-pay

## 2024-04-08 ENCOUNTER — Encounter (HOSPITAL_BASED_OUTPATIENT_CLINIC_OR_DEPARTMENT_OTHER): Payer: Self-pay

## 2024-04-15 ENCOUNTER — Institutional Professional Consult (permissible substitution) (HOSPITAL_BASED_OUTPATIENT_CLINIC_OR_DEPARTMENT_OTHER): Admitting: Nurse Practitioner

## 2024-04-17 ENCOUNTER — Ambulatory Visit (HOSPITAL_COMMUNITY)

## 2024-04-24 ENCOUNTER — Encounter (HOSPITAL_COMMUNITY): Payer: Self-pay

## 2024-04-27 ENCOUNTER — Ambulatory Visit (HOSPITAL_COMMUNITY)
Admission: RE | Admit: 2024-04-27 | Discharge: 2024-04-27 | Disposition: A | Discharge status: Home / Self Care | Source: Ambulatory Visit | Attending: Cardiology | Admitting: Cardiology

## 2024-04-27 DIAGNOSIS — R072 Precordial pain: Secondary | ICD-10-CM | POA: Diagnosis not present

## 2024-04-27 MED ORDER — IOHEXOL 350 MG/ML SOLN
100.0000 mL | Freq: Once | INTRAVENOUS | Status: AC | PRN
  Administered 2024-04-27: 100 mL via INTRAVENOUS

## 2024-04-27 MED ORDER — NITROGLYCERIN 0.4 MG SL SUBL
0.8000 mg | SUBLINGUAL_TABLET | Freq: Once | SUBLINGUAL | Status: AC
  Administered 2024-04-27: 0.8 mg via SUBLINGUAL

## 2024-04-28 ENCOUNTER — Ambulatory Visit: Payer: Self-pay | Admitting: Cardiology
